# Patient Record
Sex: Female | Born: 1990 | Race: Asian | Hispanic: No | Marital: Single | Smoking: Former smoker
Health system: Southern US, Community
[De-identification: ages and names within clinical notes are randomized; demographics above are authoritative.]

## PROBLEM LIST (undated history)

## (undated) ENCOUNTER — Inpatient Hospital Stay (HOSPITAL_COMMUNITY): Payer: Medicaid Other

## (undated) ENCOUNTER — Inpatient Hospital Stay (HOSPITAL_COMMUNITY): Payer: Self-pay

## (undated) DIAGNOSIS — N390 Urinary tract infection, site not specified: Secondary | ICD-10-CM

## (undated) DIAGNOSIS — L0390 Cellulitis, unspecified: Secondary | ICD-10-CM

## (undated) HISTORY — PX: TONSILECTOMY, ADENOIDECTOMY, BILATERAL MYRINGOTOMY AND TUBES: SHX2538

---
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
## 2010-11-24 DIAGNOSIS — L0390 Cellulitis, unspecified: Secondary | ICD-10-CM

## 2010-11-24 HISTORY — DX: Cellulitis, unspecified: L03.90

## 2011-11-25 NOTE — L&D Delivery Note (Signed)
Delivery Note At 9:13 PM a viable female was delivered via Vaginal, Spontaneous Delivery (Presentation: Right Occiput Anterior).  APGAR: 9, 9; weight .   Placenta status: Intact, Spontaneous.  Cord: 3 vessels with the following complications: None.    Anesthesia: Epidural Local  Episiotomy: None Lacerations: 2nd degree;Perineal;Labial Suture Repair: 2.0 3.0 vicryl rapide Est. Blood Loss (mL): 350 ml  Mom to postpartum.  Baby to nursery-stable.  JACKSON-MOORE,Hayzel Ruberg A 06/09/2012, 9:47 PM

## 2012-03-14 ENCOUNTER — Inpatient Hospital Stay (HOSPITAL_COMMUNITY)
Admission: AD | Admit: 2012-03-14 | Discharge: 2012-03-14 | Disposition: A | Discharge status: Home / Self Care | Payer: Medicaid Other | Source: Ambulatory Visit | Attending: Obstetrics & Gynecology | Admitting: Obstetrics & Gynecology

## 2012-03-14 ENCOUNTER — Encounter (HOSPITAL_COMMUNITY): Payer: Self-pay

## 2012-03-14 DIAGNOSIS — O368190 Decreased fetal movements, unspecified trimester, not applicable or unspecified: Secondary | ICD-10-CM

## 2012-03-14 NOTE — Discharge Instructions (Signed)
Fetal Movement Counts Patient Name: __________________________________________________ Patient Due Date: ____________________ Kick counts is highly recommended in high risk pregnancies, but it is a good idea for every pregnant woman to do. Start counting fetal movements at 28 weeks of the pregnancy. Fetal movements increase after eating a full meal or eating or drinking something sweet (the blood sugar is higher). It is also important to drink plenty of fluids (well hydrated) before doing the count. Lie on your left side because it helps with the circulation or you can sit in a comfortable chair with your arms over your belly (abdomen) with no distractions around you. DOING THE COUNT  Try to do the count the same time of day each time you do it.   Mark the day and time, then see how long it takes for you to feel 10 movements (kicks, flutters, swishes, rolls). You should have at least 10 movements within 2 hours. You will most likely feel 10 movements in much less than 2 hours. If you do not, wait an hour and count again. After a couple of days you will see a pattern.   What you are looking for is a change in the pattern or not enough counts in 2 hours. Is it taking longer in time to reach 10 movements?  SEEK MEDICAL CARE IF:  You feel less than 10 counts in 2 hours. Tried twice.   No movement in one hour.   The pattern is changing or taking longer each day to reach 10 counts in 2 hours.   You feel the baby is not moving as it usually does.  Date: ____________ Movements: ____________ Start time: ____________ Finish time: ____________  Date: ____________ Movements: ____________ Start time: ____________ Finish time: ____________ Date: ____________ Movements: ____________ Start time: ____________ Finish time: ____________ Date: ____________ Movements: ____________ Start time: ____________ Finish time: ____________ Date: ____________ Movements: ____________ Start time: ____________ Finish time:  ____________ Date: ____________ Movements: ____________ Start time: ____________ Finish time: ____________ Date: ____________ Movements: ____________ Start time: ____________ Finish time: ____________ Date: ____________ Movements: ____________ Start time: ____________ Finish time: ____________  Date: ____________ Movements: ____________ Start time: ____________ Finish time: ____________ Date: ____________ Movements: ____________ Start time: ____________ Finish time: ____________ Date: ____________ Movements: ____________ Start time: ____________ Finish time: ____________ Date: ____________ Movements: ____________ Start time: ____________ Finish time: ____________ Date: ____________ Movements: ____________ Start time: ____________ Finish time: ____________ Date: ____________ Movements: ____________ Start time: ____________ Finish time: ____________ Date: ____________ Movements: ____________ Start time: ____________ Finish time: ____________  Date: ____________ Movements: ____________ Start time: ____________ Finish time: ____________ Date: ____________ Movements: ____________ Start time: ____________ Finish time: ____________ Date: ____________ Movements: ____________ Start time: ____________ Finish time: ____________ Date: ____________ Movements: ____________ Start time: ____________ Finish time: ____________ Date: ____________ Movements: ____________ Start time: ____________ Finish time: ____________ Date: ____________ Movements: ____________ Start time: ____________ Finish time: ____________ Date: ____________ Movements: ____________ Start time: ____________ Finish time: ____________  Date: ____________ Movements: ____________ Start time: ____________ Finish time: ____________ Date: ____________ Movements: ____________ Start time: ____________ Finish time: ____________ Date: ____________ Movements: ____________ Start time: ____________ Finish time: ____________ Date: ____________ Movements:  ____________ Start time: ____________ Finish time: ____________ Date: ____________ Movements: ____________ Start time: ____________ Finish time: ____________ Date: ____________ Movements: ____________ Start time: ____________ Finish time: ____________ Date: ____________ Movements: ____________ Start time: ____________ Finish time: ____________  Date: ____________ Movements: ____________ Start time: ____________ Finish time: ____________ Date: ____________ Movements: ____________ Start time: ____________ Finish time: ____________ Date: ____________ Movements: ____________ Start time:   ____________ Finish time: ____________ Date: ____________ Movements: ____________ Start time: ____________ Finish time: ____________ Date: ____________ Movements: ____________ Start time: ____________ Finish time: ____________ Date: ____________ Movements: ____________ Start time: ____________ Finish time: ____________ Date: ____________ Movements: ____________ Start time: ____________ Finish time: ____________  Date: ____________ Movements: ____________ Start time: ____________ Finish time: ____________ Date: ____________ Movements: ____________ Start time: ____________ Finish time: ____________ Date: ____________ Movements: ____________ Start time: ____________ Finish time: ____________ Date: ____________ Movements: ____________ Start time: ____________ Finish time: ____________ Date: ____________ Movements: ____________ Start time: ____________ Finish time: ____________ Date: ____________ Movements: ____________ Start time: ____________ Finish time: ____________ Date: ____________ Movements: ____________ Start time: ____________ Finish time: ____________  Date: ____________ Movements: ____________ Start time: ____________ Finish time: ____________ Date: ____________ Movements: ____________ Start time: ____________ Finish time: ____________ Date: ____________ Movements: ____________ Start time: ____________ Finish  time: ____________ Date: ____________ Movements: ____________ Start time: ____________ Finish time: ____________ Date: ____________ Movements: ____________ Start time: ____________ Finish time: ____________ Date: ____________ Movements: ____________ Start time: ____________ Finish time: ____________ Date: ____________ Movements: ____________ Start time: ____________ Finish time: ____________  Date: ____________ Movements: ____________ Start time: ____________ Finish time: ____________ Date: ____________ Movements: ____________ Start time: ____________ Finish time: ____________ Date: ____________ Movements: ____________ Start time: ____________ Finish time: ____________ Date: ____________ Movements: ____________ Start time: ____________ Finish time: ____________ Date: ____________ Movements: ____________ Start time: ____________ Finish time: ____________ Date: ____________ Movements: ____________ Start time: ____________ Finish time: ____________ Document Released: 12/10/2006 Document Revised: 10/30/2011 Document Reviewed: 06/12/2009 ExitCare Patient Information 2012 [employer]. 

## 2012-03-14 NOTE — MAU Note (Signed)
Bernita Buffy CNM into perform bedside ultrasound

## 2012-03-14 NOTE — MAU Provider Note (Signed)
.   Chief Complaint:  Decreased fetal movement  First Provider Initiated Contact with Patient 03/14/12 1139      HPI  Beverly Myers is  21 y.o. G1P0 at Unknown presents with 2 d hx. Feeling less frequent movements than usual. Had no movement for 2 hours after awakening today but now is feeling good fetal movement. Denies contractions, leakage of fluid or vaginal bleeding.  Pregnancy Course: uncomplicated  Past Medical History: Past Medical History  Diagnosis Date  . No pertinent past medical history     Past Surgical History: Past Surgical History  Procedure Date  . Tonsilectomy, adenoidectomy, bilateral myringotomy and tubes   . No past surgeries     Family History: No family history on file.  Social History: History  Substance Use Topics  . Smoking status: Former Games developer  . Smokeless tobacco: Not on file  . Alcohol Use: No    Allergies: No Known Allergies  Meds:  Prescriptions prior to admission  Medication Sig Dispense Refill  . Prenatal Vit-Fe Fumarate-FA (PRENATAL MULTIVITAMIN) TABS Take 1 tablet by mouth every morning.             Physical Exam  Blood pressure 112/72, pulse 98, temperature 98.3 F (36.8 C), temperature source Oral, resp. rate 16. GENERAL: Well-developed, well-nourished female in no acute distress.  ABDOMEN: Soft, nontender, nondistended, gravid 51fb below costal margin  FHT:  Baseline 140 , moderate variability, 10 bpm accelerations present,  Isolated mild variable deceleration Contractions: none    Imaging:   Bedside US by me: good FM and normal AFV Assessment: G1 28 wk  Decreased fetal movement resolved  Plan: Medication List  As of 03/14/2012 11:40 AM   ASK your doctor about these medications         prenatal multivitamin Tabs          Kick counts Keep appointment this week for prenatal visit         Brent Taillon 4/21/201311:40 AM

## 2012-03-22 LAB — OB RESULTS CONSOLE ANTIBODY SCREEN: Antibody Screen: NEGATIVE

## 2012-03-22 LAB — OB RESULTS CONSOLE RPR: RPR: NONREACTIVE

## 2012-03-22 LAB — OB RESULTS CONSOLE HIV ANTIBODY (ROUTINE TESTING): HIV: NONREACTIVE

## 2012-03-22 LAB — OB RESULTS CONSOLE ABO/RH: RH Type: POSITIVE

## 2012-05-01 ENCOUNTER — Encounter (HOSPITAL_COMMUNITY): Payer: Self-pay | Admitting: *Deleted

## 2012-05-01 ENCOUNTER — Inpatient Hospital Stay (HOSPITAL_COMMUNITY)
Admission: AD | Admit: 2012-05-01 | Discharge: 2012-05-01 | Disposition: A | Discharge status: Home / Self Care | Payer: Medicaid Other | Source: Ambulatory Visit | Attending: Obstetrics & Gynecology | Admitting: Obstetrics & Gynecology

## 2012-05-01 DIAGNOSIS — O26899 Other specified pregnancy related conditions, unspecified trimester: Secondary | ICD-10-CM

## 2012-05-01 DIAGNOSIS — R109 Unspecified abdominal pain: Secondary | ICD-10-CM

## 2012-05-01 DIAGNOSIS — O99891 Other specified diseases and conditions complicating pregnancy: Secondary | ICD-10-CM | POA: Insufficient documentation

## 2012-05-01 LAB — URINE MICROSCOPIC-ADD ON

## 2012-05-01 LAB — URINALYSIS, ROUTINE W REFLEX MICROSCOPIC
Glucose, UA: NEGATIVE mg/dL
Hgb urine dipstick: NEGATIVE
Ketones, ur: NEGATIVE mg/dL
Protein, ur: NEGATIVE mg/dL

## 2012-05-01 NOTE — MAU Note (Signed)
Patient states onset of abdominal pressure since this morning, no vaginal bleeding, no dysuria.

## 2012-05-01 NOTE — MAU Provider Note (Signed)
History   Pt presents today c/o lower abd pain and pressure since she woke up this am. She states the pain was worse when she was walking at work. She has noticed improvement since sitting in the MAU. She reports GFM and denies vag dc, bleeding, fever, or any other sx at this time.  CSN: [id number]  Arrival date and time: 05/01/12 1459   First Provider Initiated Contact with Patient 05/01/12 1649      Chief Complaint  Patient presents with  . Abdominal Pain   HPI  OB History    Grav Para Term Preterm Abortions TAB SAB Ect Mult Living   1               Past Medical History  Diagnosis Date  . No pertinent past medical history     Past Surgical History  Procedure Date  . Tonsilectomy, adenoidectomy, bilateral myringotomy and tubes     No family history on file.  History  Substance Use Topics  . Smoking status: Former Games developer  . Smokeless tobacco: Not on file  . Alcohol Use: No    Allergies: No Known Allergies  Prescriptions prior to admission  Medication Sig Dispense Refill  . Prenatal Vit-Fe Fumarate-FA (PRENATAL MULTIVITAMIN) TABS Take 1 tablet by mouth every morning.        Review of Systems  Constitutional: Negative for fever and chills.  Eyes: Negative for blurred vision, double vision and photophobia.  Respiratory: Negative for cough, hemoptysis, sputum production, shortness of breath and wheezing.   Cardiovascular: Negative for chest pain and palpitations.  Gastrointestinal: Positive for abdominal pain. Negative for nausea, vomiting, diarrhea and constipation.  Genitourinary: Negative for dysuria, urgency, frequency, hematuria and flank pain.  Neurological: Negative for dizziness and headaches.  Psychiatric/Behavioral: Negative for depression and suicidal ideas.   Physical Exam   Blood pressure 102/60, pulse 97, temperature 98.3 F (36.8 C), temperature source Oral, height 5\' 3"  (1.6 m), weight 133 lb 12.8 oz (60.691 kg).  Physical Exam  Nursing note  and vitals reviewed. Constitutional: She is oriented to person, place, and time. She appears well-developed and well-nourished. No distress.  HENT:  Head: Normocephalic and atraumatic.  Eyes: EOM are normal. Pupils are equal, round, and reactive to light.  GI: Soft. She exhibits no distension and no mass. There is no tenderness. There is no rebound and no guarding.  Genitourinary: No bleeding around the vagina. No vaginal discharge found.       Cervix Lg/closed.  Neurological: She is alert and oriented to person, place, and time.  Skin: Skin is warm and dry. She is not diaphoretic.  Psychiatric: She has a normal mood and affect. Her behavior is normal. Judgment and thought content normal.    MAU Course  Procedures  NST reactive with irregular ctx.   Results for orders placed during the hospital encounter of 05/01/12 (from the past 48 hour(s))  URINALYSIS, ROUTINE W REFLEX MICROSCOPIC     Status: Abnormal   Collection Time   05/01/12  3:10 PM      Component Value Range Comment   Color, Urine YELLOW  YELLOW     APPearance CLEAR  CLEAR     Specific Gravity, Urine 1.015  1.005 - 1.030     pH 6.5  5.0 - 8.0     Glucose, UA NEGATIVE  NEGATIVE (mg/dL)    Hgb urine dipstick NEGATIVE  NEGATIVE     Bilirubin Urine NEGATIVE  NEGATIVE     Ketones, ur  NEGATIVE  NEGATIVE (mg/dL)    Protein, ur NEGATIVE  NEGATIVE (mg/dL)    Urobilinogen, UA 0.2  0.0 - 1.0 (mg/dL)    Nitrite NEGATIVE  NEGATIVE     Leukocytes, UA TRACE (*) NEGATIVE    URINE MICROSCOPIC-ADD ON     Status: Abnormal   Collection Time   05/01/12  3:10 PM      Component Value Range Comment   Squamous Epithelial / LPF FEW (*) RARE     WBC, UA 3-6  <3 (WBC/hpf)    RBC / HPF 0-2  <3 (RBC/hpf)    Bacteria, UA RARE  RARE     Urine-Other MUCOUS PRESENT        Assessment and Plan  Abd pain in preg: discussed with pt at length. Discussed diet, activity, risks, and precautions. She will f/u with Dr. Tamela Oddi. Reminded of  [facility].  Clinton Gallant. Jayzen Paver III, DrHSc, MPAS, PA-C  05/01/2012, 5:01 PM

## 2012-05-01 NOTE — MAU Note (Signed)
Patient is [redacted]w[redacted]d

## 2012-05-01 NOTE — Discharge Instructions (Signed)
Abdominal Pain During Pregnancy °Belly (abdominal) pain is common during pregnancy. Most of the time, it is not a serious problem. Other times, it can be a sign that something is wrong with the pregnancy. Always tell your doctor if you have belly pain. °HOME CARE °For mild pain: °· Do not have sex (intercourse) or put anything in your vagina until you feel better.  °· Rest until your pain stops. If your pain lasts longer than 1 hour, call your doctor.  °· Drink clear fluids if you feel sick to your stomach (nauseous).  °· Do not eat solid food until you feel better.  °· Only take medicine as told by your doctor.  °· Keep all doctor visits as told.  °GET HELP RIGHT AWAY IF:  °· You are bleeding, leaking fluid, or pieces of tissue come out of your vagina.  °· You have more pain or cramping.  °· You keep throwing up (vomiting).  °· You have pain when you pee (urinate) or have blood in your pee.  °· You have a fever.  °· You do not feel your baby moving as much.  °· You feel very weak or feel like passing out.  °· You have trouble breathing, with or without belly pain.  °· You have a very bad headache and belly pain.  °· You have fluid leaking from your vagina and belly pain.  °· You keep having watery poop (diarrhea).  °· Your belly pain does not go away after resting, or the pain gets worse.  °MAKE SURE YOU:  °· Understand these instructions.  °· Will watch your condition.  °· Will get help right away if you are not doing well or get worse.  °Document Released: 10/29/2009 Document Revised: 10/30/2011 Document Reviewed: 06/06/2011 °Exit[employer]® Patient Information ©2012 [employer]. °

## 2012-05-12 LAB — OB RESULTS CONSOLE GBS: GBS: NEGATIVE

## 2012-06-06 ENCOUNTER — Inpatient Hospital Stay (HOSPITAL_COMMUNITY)
Admission: AD | Admit: 2012-06-06 | Discharge: 2012-06-07 | Disposition: A | Discharge status: Home / Self Care | Payer: Medicaid Other | Source: Ambulatory Visit | Attending: Obstetrics & Gynecology | Admitting: Obstetrics & Gynecology

## 2012-06-06 ENCOUNTER — Encounter (HOSPITAL_COMMUNITY): Payer: Self-pay | Admitting: *Deleted

## 2012-06-06 DIAGNOSIS — O99891 Other specified diseases and conditions complicating pregnancy: Secondary | ICD-10-CM | POA: Insufficient documentation

## 2012-06-06 DIAGNOSIS — O479 False labor, unspecified: Secondary | ICD-10-CM | POA: Insufficient documentation

## 2012-06-06 NOTE — MAU Note (Signed)
Patient on the treadmill and felt like something was about to come out. Called EMS. No cord showing nor leaking fluid. Good fetal movement. No vaginal bleeding.

## 2012-06-08 ENCOUNTER — Inpatient Hospital Stay (HOSPITAL_COMMUNITY)
Admission: AD | Admit: 2012-06-08 | Discharge: 2012-06-11 | DRG: 775 | Disposition: A | Discharge status: Home / Self Care | Payer: Medicaid Other | Source: Ambulatory Visit | Attending: Obstetrics | Admitting: Obstetrics

## 2012-06-08 ENCOUNTER — Other Ambulatory Visit: Payer: Self-pay | Admitting: Obstetrics

## 2012-06-08 ENCOUNTER — Encounter (HOSPITAL_COMMUNITY): Payer: Self-pay | Admitting: Obstetrics

## 2012-06-08 DIAGNOSIS — O368190 Decreased fetal movements, unspecified trimester, not applicable or unspecified: Secondary | ICD-10-CM | POA: Diagnosis present

## 2012-06-08 DIAGNOSIS — O480 Post-term pregnancy: Principal | ICD-10-CM | POA: Diagnosis present

## 2012-06-08 DIAGNOSIS — Z3689 Encounter for other specified antenatal screening: Secondary | ICD-10-CM

## 2012-06-08 HISTORY — DX: Cellulitis, unspecified: L03.90

## 2012-06-08 HISTORY — DX: Urinary tract infection, site not specified: N39.0

## 2012-06-08 LAB — TYPE AND SCREEN

## 2012-06-08 LAB — CBC
HCT: 38.7 % (ref 36.0–46.0)
Hemoglobin: 12.7 g/dL (ref 12.0–15.0)
MCH: 28.8 pg (ref 26.0–34.0)
MCHC: 32.8 g/dL (ref 30.0–36.0)
RDW: 14.2 % (ref 11.5–15.5)

## 2012-06-08 MED ORDER — MISOPROSTOL 25 MCG QUARTER TABLET
25.0000 ug | ORAL_TABLET | ORAL | Status: DC | PRN
  Administered 2012-06-08: 25 ug via VAGINAL
  Filled 2012-06-08: qty 0.25
  Filled 2012-06-08: qty 1

## 2012-06-08 MED ORDER — IBUPROFEN 600 MG PO TABS: 600.0000 mg | ORAL_TABLET | Freq: Four times a day (QID) | ORAL | Status: DC | PRN

## 2012-06-08 MED ORDER — OXYTOCIN 40 UNITS IN LACTATED RINGERS INFUSION - SIMPLE MED: 62.5000 mL/h | Freq: Once | INTRAVENOUS | Status: DC

## 2012-06-08 MED ORDER — MISOPROSTOL 25 MCG QUARTER TABLET: 25.0000 ug | ORAL_TABLET | ORAL | Status: DC | PRN

## 2012-06-08 MED ORDER — HYDROXYZINE HCL 50 MG/ML IM SOLN: 50.0000 mg | Freq: Four times a day (QID) | INTRAMUSCULAR | Status: DC | PRN

## 2012-06-08 MED ORDER — OXYTOCIN BOLUS FROM INFUSION
250.0000 mL | Freq: Once | INTRAVENOUS | Status: AC
  Administered 2012-06-09: 250 mL via INTRAVENOUS
  Filled 2012-06-08: qty 500

## 2012-06-08 MED ORDER — LACTATED RINGERS IV SOLN
INTRAVENOUS | Status: DC
  Administered 2012-06-09 (×2): via INTRAVENOUS

## 2012-06-08 MED ORDER — LIDOCAINE HCL (PF) 1 % IJ SOLN
30.0000 mL | INTRAMUSCULAR | Status: DC | PRN
  Administered 2012-06-09: 30 mL via SUBCUTANEOUS
  Filled 2012-06-08: qty 30

## 2012-06-08 MED ORDER — TERBUTALINE SULFATE 1 MG/ML IJ SOLN: 0.2500 mg | Freq: Once | INTRAMUSCULAR | Status: AC | PRN

## 2012-06-08 MED ORDER — OXYCODONE-ACETAMINOPHEN 5-325 MG PO TABS: 1.0000 | ORAL_TABLET | ORAL | Status: DC | PRN

## 2012-06-08 MED ORDER — HYDROXYZINE HCL 50 MG PO TABS
50.0000 mg | ORAL_TABLET | Freq: Four times a day (QID) | ORAL | Status: DC | PRN
  Filled 2012-06-08: qty 1

## 2012-06-08 MED ORDER — PROMETHAZINE HCL 25 MG/ML IJ SOLN: 12.5000 mg | Freq: Four times a day (QID) | INTRAMUSCULAR | Status: DC | PRN

## 2012-06-08 MED ORDER — ACETAMINOPHEN 325 MG PO TABS: 650.0000 mg | ORAL_TABLET | ORAL | Status: DC | PRN

## 2012-06-08 MED ORDER — PANTOPRAZOLE SODIUM 40 MG PO TBEC
40.0000 mg | DELAYED_RELEASE_TABLET | Freq: Every day | ORAL | Status: DC | PRN
  Filled 2012-06-08: qty 1

## 2012-06-08 MED ORDER — ONDANSETRON HCL 4 MG/2ML IJ SOLN: 4.0000 mg | Freq: Four times a day (QID) | INTRAMUSCULAR | Status: DC | PRN

## 2012-06-08 MED ORDER — LACTATED RINGERS IV SOLN
500.0000 mL | INTRAVENOUS | Status: DC | PRN
  Administered 2012-06-09 (×3): 1000 mL via INTRAVENOUS

## 2012-06-08 MED ORDER — BUTORPHANOL TARTRATE 1 MG/ML IJ SOLN
1.0000 mg | INTRAMUSCULAR | Status: DC | PRN
  Administered 2012-06-09 (×2): 1 mg via INTRAVENOUS
  Filled 2012-06-08 (×2): qty 1

## 2012-06-08 MED ORDER — CITRIC ACID-SODIUM CITRATE 334-500 MG/5ML PO SOLN: 30.0000 mL | ORAL | Status: DC | PRN

## 2012-06-08 NOTE — H&P (Signed)
Beverly Myers is a 21 y.o. female presenting for IOL for decreased FM, postdates and nonreactive NST.Marland Kitchen Maternal Medical History:  Reason for admission: 21 yo G1  [facility] 06-07-12.  Presented to office with decreased FM.  NST done and was nonreactive.  Patient admitted to [facility] for IOL for decreased FM, postdates.  Fetal activity: Perceived fetal activity is decreased.   Last perceived fetal movement was within the past 12 hours.    Prenatal complications: no prenatal complications   OB History    Grav Para Term Preterm Abortions TAB SAB Ect Mult Living   1              Past Medical History  Diagnosis Date  . No pertinent past medical history    Past Surgical History  Procedure Date  . Tonsilectomy, adenoidectomy, bilateral myringotomy and tubes    Family History: family history is not on file. Social History:  reports that she has quit smoking. She does not have any smokeless tobacco history on file. She reports that she does not drink alcohol or use illicit drugs.   Prenatal Transfer Tool  Maternal Diabetes: No Genetic Screening: Normal Maternal Ultrasounds/Referrals: Normal Fetal Ultrasounds or other Referrals:  None Maternal Substance Abuse:  No Significant Maternal Medications:  Meds include: Other: see prenatal record Significant Maternal Lab Results:  Lab values include: Group B Strep negative Other Comments:  None  Review of Systems  All other systems reviewed and are negative.      There were no vitals taken for this visit. Maternal Exam:  Abdomen: Patient reports no abdominal tenderness. Fetal presentation: vertex  Introitus: Normal vulva. Normal vagina.  Pelvis: adequate for delivery.   Cervix: Cervix evaluated by digital exam.     Physical Exam  Nursing note and vitals reviewed. Constitutional: She is oriented to person, place, and time. She appears well-developed and well-nourished.  HENT:  Head: Normocephalic and atraumatic.  Eyes: Conjunctivae are  normal. Pupils are equal, round, and reactive to light.  Neck: Normal range of motion. Neck supple.  Cardiovascular: Normal rate and regular rhythm.   Respiratory: Effort normal.  GI: Soft.  Musculoskeletal: Normal range of motion.  Neurological: She is alert and oriented to person, place, and time.  Skin: Skin is warm and dry.  Psychiatric: She has a normal mood and affect. Her behavior is normal. Judgment and thought content normal.    Prenatal labs: ABO, Rh:   Antibody:   Rubella:   RPR:    HBsAg:    HIV:    GBS:     Assessment/Plan: 40.1 weeks.  Decreased FM.  Nonreactive NST.  Proceed with IOL.   Beverly Myers A 06/08/2012, 8:07 PM

## 2012-06-09 ENCOUNTER — Encounter (HOSPITAL_COMMUNITY): Payer: Self-pay | Admitting: Anesthesiology

## 2012-06-09 ENCOUNTER — Inpatient Hospital Stay (HOSPITAL_COMMUNITY): Payer: Medicaid Other | Admitting: Anesthesiology

## 2012-06-09 ENCOUNTER — Encounter (HOSPITAL_COMMUNITY): Payer: Self-pay

## 2012-06-09 DIAGNOSIS — O480 Post-term pregnancy: Secondary | ICD-10-CM | POA: Diagnosis present

## 2012-06-09 DIAGNOSIS — Z3689 Encounter for other specified antenatal screening: Secondary | ICD-10-CM

## 2012-06-09 LAB — RPR: RPR Ser Ql: NONREACTIVE

## 2012-06-09 MED ORDER — TERBUTALINE SULFATE 1 MG/ML IJ SOLN: 0.2500 mg | Freq: Once | INTRAMUSCULAR | Status: DC | PRN

## 2012-06-09 MED ORDER — BENZOCAINE-MENTHOL 20-0.5 % EX AERO
1.0000 "application " | INHALATION_SPRAY | CUTANEOUS | Status: DC | PRN
  Administered 2012-06-10: 1 via TOPICAL
  Filled 2012-06-09: qty 56

## 2012-06-09 MED ORDER — TETANUS-DIPHTH-ACELL PERTUSSIS 5-2.5-18.5 LF-MCG/0.5 IM SUSP: 0.5000 mL | Freq: Once | INTRAMUSCULAR | Status: DC

## 2012-06-09 MED ORDER — MAGNESIUM HYDROXIDE 400 MG/5ML PO SUSP: 30.0000 mL | ORAL | Status: DC | PRN

## 2012-06-09 MED ORDER — FERROUS SULFATE 325 (65 FE) MG PO TABS
325.0000 mg | ORAL_TABLET | Freq: Two times a day (BID) | ORAL | Status: DC
Start: 2012-06-10 — End: 2012-06-11
  Administered 2012-06-10 – 2012-06-11 (×3): 325 mg via ORAL
  Filled 2012-06-09 (×3): qty 1

## 2012-06-09 MED ORDER — PHENYLEPHRINE 40 MCG/ML (10ML) SYRINGE FOR IV PUSH (FOR BLOOD PRESSURE SUPPORT)
80.0000 ug | PREFILLED_SYRINGE | INTRAVENOUS | Status: DC | PRN
  Filled 2012-06-09: qty 5

## 2012-06-09 MED ORDER — ONDANSETRON HCL 4 MG/2ML IJ SOLN: 4.0000 mg | INTRAMUSCULAR | Status: DC | PRN

## 2012-06-09 MED ORDER — DIPHENHYDRAMINE HCL 25 MG PO CAPS: 25.0000 mg | ORAL_CAPSULE | Freq: Four times a day (QID) | ORAL | Status: DC | PRN

## 2012-06-09 MED ORDER — OXYCODONE-ACETAMINOPHEN 5-325 MG PO TABS
1.0000 | ORAL_TABLET | ORAL | Status: DC | PRN
  Administered 2012-06-10: 1 via ORAL
  Administered 2012-06-11: 2 via ORAL
  Administered 2012-06-11: 1 via ORAL
  Filled 2012-06-09 (×2): qty 1
  Filled 2012-06-09: qty 2

## 2012-06-09 MED ORDER — ZOLPIDEM TARTRATE 5 MG PO TABS: 5.0000 mg | ORAL_TABLET | Freq: Every evening | ORAL | Status: DC | PRN

## 2012-06-09 MED ORDER — MEDROXYPROGESTERONE ACETATE 150 MG/ML IM SUSP: 150.0000 mg | INTRAMUSCULAR | Status: DC | PRN

## 2012-06-09 MED ORDER — PRENATAL MULTIVITAMIN CH
1.0000 | ORAL_TABLET | Freq: Every day | ORAL | Status: DC
  Administered 2012-06-10 – 2012-06-11 (×2): 1 via ORAL
  Filled 2012-06-09 (×2): qty 1

## 2012-06-09 MED ORDER — IBUPROFEN 600 MG PO TABS
600.0000 mg | ORAL_TABLET | Freq: Four times a day (QID) | ORAL | Status: DC
  Administered 2012-06-10 – 2012-06-11 (×7): 600 mg via ORAL
  Filled 2012-06-09 (×7): qty 1

## 2012-06-09 MED ORDER — DIPHENHYDRAMINE HCL 50 MG/ML IJ SOLN: 12.5000 mg | INTRAMUSCULAR | Status: DC | PRN

## 2012-06-09 MED ORDER — LANOLIN HYDROUS EX OINT: TOPICAL_OINTMENT | CUTANEOUS | Status: DC | PRN

## 2012-06-09 MED ORDER — EPHEDRINE 5 MG/ML INJ
10.0000 mg | INTRAVENOUS | Status: DC | PRN
  Filled 2012-06-09: qty 4

## 2012-06-09 MED ORDER — ONDANSETRON HCL 4 MG PO TABS: 4.0000 mg | ORAL_TABLET | ORAL | Status: DC | PRN

## 2012-06-09 MED ORDER — FENTANYL 2.5 MCG/ML BUPIVACAINE 1/10 % EPIDURAL INFUSION (WH - ANES)
INTRAMUSCULAR | Status: DC | PRN
  Administered 2012-06-09: 14 mL/h via EPIDURAL

## 2012-06-09 MED ORDER — MEASLES, MUMPS & RUBELLA VAC ~~LOC~~ INJ
0.5000 mL | INJECTION | Freq: Once | SUBCUTANEOUS | Status: DC
  Filled 2012-06-09: qty 0.5

## 2012-06-09 MED ORDER — SODIUM BICARBONATE 8.4 % IV SOLN
INTRAVENOUS | Status: DC | PRN
  Administered 2012-06-09: 4 mL via EPIDURAL

## 2012-06-09 MED ORDER — PHENYLEPHRINE 40 MCG/ML (10ML) SYRINGE FOR IV PUSH (FOR BLOOD PRESSURE SUPPORT): 80.0000 ug | PREFILLED_SYRINGE | INTRAVENOUS | Status: DC | PRN

## 2012-06-09 MED ORDER — WITCH HAZEL-GLYCERIN EX PADS: 1.0000 | MEDICATED_PAD | CUTANEOUS | Status: DC | PRN

## 2012-06-09 MED ORDER — OXYTOCIN 40 UNITS IN LACTATED RINGERS INFUSION - SIMPLE MED
1.0000 m[IU]/min | INTRAVENOUS | Status: DC
  Administered 2012-06-09: 1 m[IU]/min via INTRAVENOUS
  Filled 2012-06-09: qty 1000

## 2012-06-09 MED ORDER — LACTATED RINGERS IV SOLN: 500.0000 mL | Freq: Once | INTRAVENOUS | Status: DC

## 2012-06-09 MED ORDER — DIBUCAINE 1 % RE OINT: 1.0000 "application " | TOPICAL_OINTMENT | RECTAL | Status: DC | PRN

## 2012-06-09 MED ORDER — SENNOSIDES-DOCUSATE SODIUM 8.6-50 MG PO TABS
2.0000 | ORAL_TABLET | Freq: Every day | ORAL | Status: DC
  Administered 2012-06-10: 2 via ORAL

## 2012-06-09 MED ORDER — FENTANYL 2.5 MCG/ML BUPIVACAINE 1/10 % EPIDURAL INFUSION (WH - ANES)
14.0000 mL/h | INTRAMUSCULAR | Status: DC
  Administered 2012-06-09: 14 mL/h via EPIDURAL
  Filled 2012-06-09 (×2): qty 60

## 2012-06-09 MED ORDER — EPHEDRINE 5 MG/ML INJ: 10.0000 mg | INTRAVENOUS | Status: DC | PRN

## 2012-06-09 NOTE — Progress Notes (Signed)
Informed Dr Clearance Coots via phone that pt was contracting every 2-4 minutes. Will hold next scheduled dose of Cytotec and was given orders to start Pitocin at 0500

## 2012-06-09 NOTE — Progress Notes (Signed)
Beverly Myers is a 21 y.o. G1P0000 at [redacted]w[redacted]d by LMP admitted for induction of labor due to Non-reactive NST and decreased fetal movement..  Subjective:  No complaints.   Objective: BP 120/77  Pulse 67  Temp 98.4 F (36.9 C) (Oral)  Resp 18  Ht 5\' 3"  (1.6 m)  Wt 63.05 kg (139 lb)  BMI 24.62 kg/m2      FHT:  FHR: 150 bpm, variability: moderate,  accelerations:  Present,  decelerations:  Absent UC:   regular, every 3-5 minutes SVE:   Dilation: 1.5 Effacement (%): 60 Station: -2 Exam by:: Larose Kells RN  Labs: Lab Results  Component Value Date   WBC 7.6 06/08/2012   HGB 12.7 06/08/2012   HCT 38.7 06/08/2012   MCV 87.8 06/08/2012   PLT 204 06/08/2012    Assessment / Plan: Induction of labor due to nonreactive NST and decreased fetal movement.,  progressing well on pitocin  Labor: Progressing normally Preeclampsia:  n/a Fetal Wellbeing:  Category I Pain Control:  Labor support without medications I/D:  n/a Anticipated MOD:  NSVD  Beverly Myers A 06/09/2012, 8:56 AM

## 2012-06-09 NOTE — Anesthesia Preprocedure Evaluation (Addendum)

## 2012-06-09 NOTE — Anesthesia Procedure Notes (Signed)

## 2012-06-09 NOTE — Progress Notes (Signed)
Beverly Myers is a 21 y.o. G1P0000 at [redacted]w[redacted]d by LMP admitted for induction of labor due to Non-reactive NST.  Subjective: Comfortable  Objective: BP 109/54  Pulse 63  Temp 99.3 F (37.4 C) (Oral)  Resp 16  Ht 5\' 3"  (1.6 m)  Wt 63.05 kg (139 lb)  BMI 24.62 kg/m2  SpO2 97% I/O last 3 completed shifts: In: -  Out: 150 [Urine:150]    FHT:  FHR: 140 bpm, variability: moderate,  accelerations:  Present,  decelerations:  Absent UC:   regular, every 2 minutes SVE:   Dilation: 10 Effacement (%): 90 Station: +1 Exam by:: l.poore, rn  Labs: Lab Results  Component Value Date   WBC 7.6 06/08/2012   HGB 12.7 06/08/2012   HCT 38.7 06/08/2012   MCV 87.8 06/08/2012   PLT 204 06/08/2012    Assessment / Plan: Induction of labor due to non-reactive NST,  progressing well on pitocin Stage II Malpresentation/OP  Labor: Progressing normally and passive second stage for now Preeclampsia:  n/a Fetal Wellbeing:  Category I Pain Control:  Epidural I/D:  n/a Anticipated MOD:  NSVD  JACKSON-MOORE,Dannika Hilgeman A 06/09/2012, 8:20 PM

## 2012-06-10 NOTE — Progress Notes (Signed)
UR Chart review completed.  

## 2012-06-10 NOTE — Progress Notes (Signed)
Post Partum Day 1 Subjective: no complaints, up ad lib, voiding and tolerating PO  Objective: Blood pressure 100/60, pulse 71, temperature 98.1 F (36.7 C), temperature source Oral, resp. rate 16, height 5\' 3"  (1.6 m), weight 63.05 kg (139 lb), SpO2 97.00%, unknown if currently breastfeeding.  Physical Exam:  General: alert and no distress Lochia: appropriate Uterine Fundus: firm Incision: healing well DVT Evaluation: No evidence of DVT seen on physical exam.   Basename 06/08/12 2045  HGB 12.7  HCT 38.7    Assessment/Plan: Plan for discharge tomorrow   LOS: 2 days   [name] A 06/10/2012, 8:14 AM

## 2012-06-10 NOTE — Anesthesia Postprocedure Evaluation (Signed)
  Anesthesia Post-op Note  Patient: Beverly Myers  Procedure(s) Performed: * No procedures listed *  Patient Location: Mother/Baby  Anesthesia Type: Epidural  Level of Consciousness: awake  Airway and Oxygen Therapy: Patient Spontanous Breathing  Post-op Pain: mild  Post-op Assessment: Patient's Cardiovascular Status Stable and Respiratory Function Stable  Post-op Vital Signs: stable  Complications: No apparent anesthesia complications

## 2012-06-11 MED ORDER — OXYCODONE-ACETAMINOPHEN 5-325 MG PO TABS: 1.0000 | ORAL_TABLET | ORAL | Status: AC | PRN

## 2012-06-11 MED ORDER — IBUPROFEN 600 MG PO TABS: 600.0000 mg | ORAL_TABLET | Freq: Four times a day (QID) | ORAL | Status: AC

## 2012-06-11 NOTE — Progress Notes (Signed)
Post Partum Day 2 Subjective: no complaints, up ad lib, voiding and tolerating PO  Objective: Blood pressure 99/62, pulse 99, temperature 98.2 F (36.8 C), temperature source Oral, resp. rate 18, height 5\' 3"  (1.6 m), weight 63.05 kg (139 lb), SpO2 99.00%, unknown if currently breastfeeding.  Physical Exam:  General: alert and no distress Lochia: appropriate Uterine Fundus: firm Incision: healing well DVT Evaluation: No evidence of DVT seen on physical exam.   Basename 06/08/12 2045  HGB 12.7  HCT 38.7    Assessment/Plan: Discharge home   LOS: 3 days   Shakeda Pearse A 06/11/2012, 7:57 AM

## 2012-06-11 NOTE — Discharge Summary (Signed)
Obstetric Discharge Summary Reason for Admission: induction of labor Prenatal Procedures: NST and ultrasound Intrapartum Procedures: spontaneous vaginal delivery Postpartum Procedures: none Complications-Operative and Postpartum: none Hemoglobin  Date Value Range Status  06/08/2012 12.7  12.0 - 15.0 g/dL Final     HCT  Date Value Range Status  06/08/2012 38.7  36.0 - 46.0 % Final    Physical Exam:  General: alert and no distress Lochia: appropriate Uterine Fundus: firm Incision: healing well DVT Evaluation: No evidence of DVT seen on physical exam.  Discharge Diagnoses: Term Pregnancy-delivered  Discharge Information: Date: 06/11/2012 Activity: pelvic rest Diet: routine Medications: PNV, Ibuprofen, Colace and Percocet Condition: stable Instructions: refer to practice specific booklet Discharge to: home Follow-up Information    Follow up with Antionette Char A, MD. Schedule an appointment as soon as possible for a visit in 6 weeks.   Contact information:   [address] [place] [address] [phone]          Newborn Data: Live born female  Birth Weight: 7 lb 14.1 oz (3575 g) APGAR: 9, 9  Home with mother.  Teryl Gubler A 06/11/2012, 8:02 AM

## 2013-10-26 ENCOUNTER — Ambulatory Visit (INDEPENDENT_AMBULATORY_CARE_PROVIDER_SITE_OTHER): Payer: Medicaid Other | Admitting: Obstetrics & Gynecology

## 2013-10-26 ENCOUNTER — Encounter: Payer: Self-pay | Admitting: Obstetrics & Gynecology

## 2013-10-26 VITALS — BP 117/72 | HR 83 | Temp 98.7°F | Ht 63.0 in | Wt 101.0 lb

## 2013-10-26 DIAGNOSIS — Z0000 Encounter for general adult medical examination without abnormal findings: Secondary | ICD-10-CM

## 2013-10-26 DIAGNOSIS — Z3202 Encounter for pregnancy test, result negative: Secondary | ICD-10-CM

## 2013-10-26 LAB — POCT URINALYSIS DIPSTICK
Blood, UA: NEGATIVE
Ketones, UA: NEGATIVE
Protein, UA: NEGATIVE
Spec Grav, UA: <=1.005
Urobilinogen, UA: 1

## 2013-10-26 LAB — COMPREHENSIVE METABOLIC PANEL
ALT: 8 U/L (ref 0–35)
AST: 14 U/L (ref 0–37)
CO2: 26 mEq/L (ref 19–32)
Calcium: 9.8 mg/dL (ref 8.4–10.5)
Chloride: 104 mEq/L (ref 96–112)
Creat: 0.71 mg/dL (ref 0.50–1.10)
Sodium: 139 mEq/L (ref 135–145)
Total Bilirubin: 0.9 mg/dL (ref 0.3–1.2)
Total Protein: 7.7 g/dL (ref 6.0–8.3)

## 2013-10-26 LAB — POCT URINE PREGNANCY: Preg Test, Ur: NEGATIVE

## 2013-10-26 NOTE — Progress Notes (Signed)
  Subjective:     Beverly Myers is a 22 y.o. female here for a routine exam.  Current complaints: none.  Personal health questionnaire reviewed: no.   Gynecologic History Patient's last menstrual period was 10/16/2013. Contraception: condoms   Obstetric History OB History  Gravida Para Term Preterm AB SAB TAB Ectopic Multiple Living  1 1 1  0 0 0 0 0 0 1    # Outcome Date GA Lbr Len/2nd Weight Sex Delivery Anes PTL Lv  1 TRM 06/09/12 [redacted]w[redacted]d 03:48 / 02:25 7 lb 14.1 oz (3.575 kg) M SVD EPI  Y       The following portions of the patient's history were reviewed and updated as appropriate: allergies, current medications, past family history, past medical history, past social history, past surgical history and problem list.  Review of Systems Pertinent items are noted in HPI.    Objective:      General appearance: alert Breasts: normal appearance, no masses or tenderness Abdomen: soft, non-tender; bowel sounds normal; no masses,  no organomegaly Pelvic: cervix normal in appearance, external genitalia normal, no adnexal masses or tenderness, uterus normal size, shape, and consistency and vagina normal without discharge    Assessment:    Healthy female exam.    Plan:   Orders Placed This Encounter  Procedures  . HIV antibody  . Hepatitis B surface antigen  . RPR  . Hepatitis C antibody  . Comprehensive metabolic panel  . TSH  . CBC  . POCT Urinalysis Dipstick  . POCT urine pregnancy  Return prn

## 2013-10-26 NOTE — Progress Notes (Deleted)
Pt in office for annual exam, complains of thick white discharge with itching.

## 2013-10-27 ENCOUNTER — Encounter: Payer: Self-pay | Admitting: Obstetrics & Gynecology

## 2013-10-27 LAB — HEPATITIS C ANTIBODY: HCV Ab: NEGATIVE

## 2013-10-27 LAB — PAP IG, CT-NG, RFX HPV ASCU: GC Probe Amp: NEGATIVE

## 2013-10-27 LAB — RPR

## 2013-10-29 NOTE — Patient Instructions (Signed)
Contraception Choices Contraception (birth control) is the use of any methods or devices to prevent pregnancy. Below are some methods to help avoid pregnancy. HORMONAL METHODS   Contraceptive implant This is a thin, plastic tube containing progesterone hormone. It does not contain estrogen hormone. Your health care provider inserts the tube in the inner part of the upper arm. The tube can remain in place for up to 3 years. After 3 years, the implant must be removed. The implant prevents the ovaries from releasing an egg (ovulation), thickens the cervical mucus to prevent sperm from entering the uterus, and thins the lining of the inside of the uterus.  Progesterone-only injections These injections are given every 3 months by your health care provider to prevent pregnancy. This synthetic progesterone hormone stops the ovaries from releasing eggs. It also thickens cervical mucus and changes the uterine lining. This makes it harder for sperm to survive in the uterus.  Birth control pills These pills contain estrogen and progesterone hormone. They work by preventing the ovaries from releasing eggs (ovulation). They also cause the cervical mucus to thicken, preventing the sperm from entering the uterus. Birth control pills are prescribed by a health care provider.Birth control pills can also be used to treat heavy periods.  Minipill This type of birth control pill contains only the progesterone hormone. They are taken every day of each month and must be prescribed by your health care provider.  Birth control patch The patch contains hormones similar to those in birth control pills. It must be changed once a week and is prescribed by a health care provider.  Vaginal ring The ring contains hormones similar to those in birth control pills. It is left in the vagina for 3 weeks, removed for 1 week, and then a new one is put back in place. The patient must be comfortable inserting and removing the ring from  the vagina.A health care provider's prescription is necessary.  Emergency contraception Emergency contraceptives prevent pregnancy after unprotected sexual intercourse. This pill can be taken right after sex or up to 5 days after unprotected sex. It is most effective the sooner you take the pills after having sexual intercourse. Most emergency contraceptive pills are available without a prescription. Check with your pharmacist. Do not use emergency contraception as your only form of birth control. BARRIER METHODS   Female condom This is a thin sheath (latex or rubber) that is worn over the penis during sexual intercourse. It can be used with spermicide to increase effectiveness.  Female condom. This is a soft, loose-fitting sheath that is put into the vagina before sexual intercourse.  Diaphragm This is a soft, latex, dome-shaped barrier that must be fitted by a health care provider. It is inserted into the vagina, along with a spermicidal jelly. It is inserted before intercourse. The diaphragm should be left in the vagina for 6 to 8 hours after intercourse.  Cervical cap This is a round, soft, latex or plastic cup that fits over the cervix and must be fitted by a health care provider. The cap can be left in place for up to 48 hours after intercourse.  Sponge This is a soft, circular piece of polyurethane foam. The sponge has spermicide in it. It is inserted into the vagina after wetting it and before sexual intercourse.  Spermicides These are chemicals that kill or block sperm from entering the cervix and uterus. They come in the form of creams, jellies, suppositories, foam, or tablets. They do not require a   prescription. They are inserted into the vagina with an applicator before having sexual intercourse. The process must be repeated every time you have sexual intercourse. INTRAUTERINE CONTRACEPTION  Intrauterine device (IUD) This is a T-shaped device that is put in a woman's uterus during a  menstrual period to prevent pregnancy. There are 2 types:  Copper IUD This type of IUD is wrapped in copper wire and is placed inside the uterus. Copper makes the uterus and fallopian tubes produce a fluid that kills sperm. It can stay in place for 10 years.  Hormone IUD This type of IUD contains the hormone progestin (synthetic progesterone). The hormone thickens the cervical mucus and prevents sperm from entering the uterus, and it also thins the uterine lining to prevent implantation of a fertilized egg. The hormone can weaken or kill the sperm that get into the uterus. It can stay in place for 3 5 years, depending on which type of IUD is used. PERMANENT METHODS OF CONTRACEPTION  Female tubal ligation This is when the woman's fallopian tubes are surgically sealed, tied, or blocked to prevent the egg from traveling to the uterus.  Hysteroscopic sterilization This involves placing a small coil or insert into each fallopian tube. Your doctor uses a technique called hysteroscopy to do the procedure. The device causes scar tissue to form. This results in permanent blockage of the fallopian tubes, so the sperm cannot fertilize the egg. It takes about 3 months after the procedure for the tubes to become blocked. You must use another form of birth control for these 3 months.  Female sterilization This is when the female has the tubes that carry sperm tied off (vasectomy).This blocks sperm from entering the vagina during sexual intercourse. After the procedure, the man can still ejaculate fluid (semen). NATURAL PLANNING METHODS  Natural family planning This is not having sexual intercourse or using a barrier method (condom, diaphragm, cervical cap) on days the woman could become pregnant.  Calendar method This is keeping track of the length of each menstrual cycle and identifying when you are fertile.  Ovulation method This is avoiding sexual intercourse during ovulation.  Symptothermal method This is  avoiding sexual intercourse during ovulation, using a thermometer and ovulation symptoms.  Post ovulation method This is timing sexual intercourse after you have ovulated. Regardless of which type or method of contraception you choose, it is important that you use condoms to protect against the transmission of sexually transmitted infections (STIs). Talk with your health care provider about which form of contraception is most appropriate for you. Document Released: 11/10/2005 Document Revised: 07/13/2013 Document Reviewed: 05/05/2013 [employer] Patient Information 2014 [employer]. Health Maintenance, Female A healthy lifestyle and preventative care can promote health and wellness.  Maintain regular health, dental, and eye exams.  Eat a healthy diet. Foods like vegetables, fruits, whole grains, low-fat dairy products, and lean protein foods contain the nutrients you need without too many calories. Decrease your intake of foods high in solid fats, added sugars, and salt. Get information about a proper diet from your caregiver, if necessary.  Regular physical exercise is one of the most important things you can do for your health. Most adults should get at least 150 minutes of moderate-intensity exercise (any activity that increases your heart rate and causes you to sweat) each week. In addition, most adults need muscle-strengthening exercises on 2 or more days a week.   Maintain a healthy weight. The body mass index (BMI) is a screening tool to identify possible weight   problems. It provides an estimate of body fat based on height and weight. Your caregiver can help determine your BMI, and can help you achieve or maintain a healthy weight. For adults 20 years and older:  A BMI below 18.5 is considered underweight.  A BMI of 18.5 to 24.9 is normal.  A BMI of 25 to 29.9 is considered overweight.  A BMI of 30 and above is considered obese.  Maintain normal blood lipids and cholesterol by  exercising and minimizing your intake of saturated fat. Eat a balanced diet with plenty of fruits and vegetables. Blood tests for lipids and cholesterol should begin at age 20 and be repeated every 5 years. If your lipid or cholesterol levels are high, you are over 50, or you are a high risk for heart disease, you may need your cholesterol levels checked more frequently.Ongoing high lipid and cholesterol levels should be treated with medicines if diet and exercise are not effective.  If you smoke, find out from your caregiver how to quit. If you do not use tobacco, do not start.  Lung cancer screening is recommended for adults aged 55 80 years who are at high risk for developing lung cancer because of a history of smoking. Yearly low-dose computed tomography (CT) is recommended for people who have at least a 30-pack-year history of smoking and are a current smoker or have quit within the past 15 years. A pack year of smoking is smoking an average of 1 pack of cigarettes a day for 1 year (for example: 1 pack a day for 30 years or 2 packs a day for 15 years). Yearly screening should continue until the smoker has stopped smoking for at least 15 years. Yearly screening should also be stopped for people who develop a health problem that would prevent them from having lung cancer treatment.  If you are pregnant, do not drink alcohol. If you are breastfeeding, be very cautious about drinking alcohol. If you are not pregnant and choose to drink alcohol, do not exceed 1 drink per day. One drink is considered to be 12 ounces (355 mL) of beer, 5 ounces (148 mL) of wine, or 1.5 ounces (44 mL) of liquor.  Avoid use of street drugs. Do not share needles with anyone. Ask for help if you need support or instructions about stopping the use of drugs.  High blood pressure causes heart disease and increases the risk of stroke. Blood pressure should be checked at least every 1 to 2 years. Ongoing high blood pressure should be  treated with medicines, if weight loss and exercise are not effective.  If you are 55 to 22 years old, ask your caregiver if you should take aspirin to prevent strokes.  Diabetes screening involves taking a blood sample to check your fasting blood sugar level. This should be done once every 3 years, after age 45, if you are within normal weight and without risk factors for diabetes. Testing should be considered at a younger age or be carried out more frequently if you are overweight and have at least 1 risk factor for diabetes.  Breast cancer screening is essential preventative care for women. You should practice "breast self-awareness." This means understanding the normal appearance and feel of your breasts and may include breast self-examination. Any changes detected, no matter how small, should be reported to a caregiver. Women in their 20s and 30s should have a clinical breast exam (CBE) by a caregiver as part of a regular health exam every   1 to 3 years. After age 40, women should have a CBE every year. Starting at age 40, women should consider having a mammogram (breast X-ray) every year. Women who have a family history of breast cancer should talk to their caregiver about genetic screening. Women at a high risk of breast cancer should talk to their caregiver about having an MRI and a mammogram every year.  Breast cancer gene (BRCA)-related cancer risk assessment is recommended for women who have family members with BRCA-related cancers. BRCA-related cancers include breast, ovarian, tubal, and peritoneal cancers. Having family members with these cancers may be associated with an increased risk for harmful changes (mutations) in the breast cancer genes BRCA1 and BRCA2. Results of the assessment will determine the need for genetic counseling and BRCA1 and BRCA2 testing.  The Pap test is a screening test for cervical cancer. Women should have a Pap test starting at age 21. Between ages 21 and 29, Pap  tests should be repeated every 2 years. Beginning at age 30, you should have a Pap test every 3 years as long as the past 3 Pap tests have been normal. If you had a hysterectomy for a problem that was not cancer or a condition that could lead to cancer, then you no longer need Pap tests. If you are between ages 65 and 70, and you have had normal Pap tests going back 10 years, you no longer need Pap tests. If you have had past treatment for cervical cancer or a condition that could lead to cancer, you need Pap tests and screening for cancer for at least 20 years after your treatment. If Pap tests have been discontinued, risk factors (such as a new sexual partner) need to be reassessed to determine if screening should be resumed. Some women have medical problems that increase the chance of getting cervical cancer. In these cases, your caregiver may recommend more frequent screening and Pap tests.  The human papillomavirus (HPV) test is an additional test that may be used for cervical cancer screening. The HPV test looks for the virus that can cause the cell changes on the cervix. The cells collected during the Pap test can be tested for HPV. The HPV test could be used to screen women aged 30 years and older, and should be used in women of any age who have unclear Pap test results. After the age of 30, women should have HPV testing at the same frequency as a Pap test.  Colorectal cancer can be detected and often prevented. Most routine colorectal cancer screening begins at the age of 50 and continues through age 75. However, your caregiver may recommend screening at an earlier age if you have risk factors for colon cancer. On a yearly basis, your caregiver may provide home test kits to check for hidden blood in the stool. Use of a small camera at the end of a tube, to directly examine the colon (sigmoidoscopy or colonoscopy), can detect the earliest forms of colorectal cancer. Talk to your caregiver about this at  age 50, when routine screening begins. Direct examination of the colon should be repeated every 5 to 10 years through age 75, unless early forms of pre-cancerous polyps or small growths are found.  Hepatitis C blood testing is recommended for all people born from 1945 through 1965 and any individual with known risks for hepatitis C.  Practice safe sex. Use condoms and avoid high-risk sexual practices to reduce the spread of sexually transmitted infections (STIs). Sexually active   women aged 25 and younger should be checked for Chlamydia, which is a common sexually transmitted infection. Older women with new or multiple partners should also be tested for Chlamydia. Testing for other STIs is recommended if you are sexually active and at increased risk.  Osteoporosis is a disease in which the bones lose minerals and strength with aging. This can result in serious bone fractures. The risk of osteoporosis can be identified using a bone density scan. Women ages 65 and over and women at risk for fractures or osteoporosis should discuss screening with their caregivers. Ask your caregiver whether you should be taking a calcium supplement or vitamin D to reduce the rate of osteoporosis.  Menopause can be associated with physical symptoms and risks. Hormone replacement therapy is available to decrease symptoms and risks. You should talk to your caregiver about whether hormone replacement therapy is right for you.  Use sunscreen. Apply sunscreen liberally and repeatedly throughout the day. You should seek shade when your shadow is shorter than you. Protect yourself by wearing long sleeves, pants, a wide-brimmed hat, and sunglasses year round, whenever you are outdoors.  Notify your caregiver of new moles or changes in moles, especially if there is a change in shape or color. Also notify your caregiver if a mole is larger than the size of a pencil eraser.  Stay current with your immunizations. Document Released:  05/26/2011 Document Revised: 03/07/2013 Document Reviewed: 05/26/2011 [employer] Patient Information 2014 [employer].  

## 2013-11-29 ENCOUNTER — Telehealth: Payer: Self-pay | Admitting: *Deleted

## 2013-12-01 ENCOUNTER — Encounter: Payer: Medicaid Other | Admitting: *Deleted

## 2013-12-15 ENCOUNTER — Encounter: Payer: Medicaid Other | Attending: Obstetrics & Gynecology | Admitting: *Deleted

## 2013-12-15 ENCOUNTER — Encounter: Payer: Self-pay | Admitting: *Deleted

## 2013-12-15 DIAGNOSIS — Z681 Body mass index (BMI) 19 or less, adult: Secondary | ICD-10-CM | POA: Insufficient documentation

## 2013-12-15 DIAGNOSIS — R636 Underweight: Secondary | ICD-10-CM

## 2013-12-15 DIAGNOSIS — Z713 Dietary counseling and surveillance: Secondary | ICD-10-CM | POA: Insufficient documentation

## 2013-12-15 DIAGNOSIS — F5000 Anorexia nervosa, unspecified: Secondary | ICD-10-CM

## 2013-12-15 DIAGNOSIS — F509 Eating disorder, unspecified: Secondary | ICD-10-CM | POA: Insufficient documentation
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
## 2013-12-19 ENCOUNTER — Other Ambulatory Visit: Payer: Self-pay | Admitting: *Deleted

## 2013-12-19 DIAGNOSIS — B379 Candidiasis, unspecified: Secondary | ICD-10-CM

## 2013-12-19 MED ORDER — FLUCONAZOLE 150 MG PO TABS: 150.0000 mg | ORAL_TABLET | Freq: Once | ORAL | Status: DC

## 2013-12-23 ENCOUNTER — Encounter: Payer: Medicaid Other | Admitting: *Deleted

## 2013-12-23 VITALS — Ht 63.0 in | Wt 98.6 lb

## 2013-12-23 DIAGNOSIS — E441 Mild protein-calorie malnutrition: Secondary | ICD-10-CM

## 2013-12-23 DIAGNOSIS — F5000 Anorexia nervosa, unspecified: Secondary | ICD-10-CM
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
## 2013-12-23 NOTE — Patient Instructions (Signed)
Aim for 3 meals each day On work days have protein bar or breakfast shake or something else easy Each meal aim for starch, protein, and fruit or vegetable Aim for 4 cups non-caffeinated beverages Take multivitamin and calcium/vitamin D

## 2013-12-26 ENCOUNTER — Ambulatory Visit (INDEPENDENT_AMBULATORY_CARE_PROVIDER_SITE_OTHER): Payer: Medicaid Other | Admitting: Obstetrics & Gynecology

## 2013-12-26 VITALS — BP 98/62 | HR 73 | Temp 98.2°F | Wt 102.0 lb

## 2013-12-26 DIAGNOSIS — Z3202 Encounter for pregnancy test, result negative: Secondary | ICD-10-CM

## 2013-12-26 DIAGNOSIS — N949 Unspecified condition associated with female genital organs and menstrual cycle: Secondary | ICD-10-CM

## 2013-12-26 DIAGNOSIS — Z113 Encounter for screening for infections with a predominantly sexual mode of transmission: Secondary | ICD-10-CM

## 2013-12-26 DIAGNOSIS — R102 Pelvic and perineal pain: Secondary | ICD-10-CM

## 2013-12-26 LAB — POCT URINALYSIS DIPSTICK
BILIRUBIN UA: NEGATIVE
GLUCOSE UA: NEGATIVE
KETONES UA: NEGATIVE
Leukocytes, UA: NEGATIVE
Nitrite, UA: NEGATIVE
RBC UA: NEGATIVE
SPEC GRAV UA: 1.015
UROBILINOGEN UA: NEGATIVE
pH, UA: 5

## 2013-12-26 LAB — POCT URINE PREGNANCY: Preg Test, Ur: NEGATIVE

## 2013-12-26 MED ORDER — CITALOPRAM HYDROBROMIDE 20 MG PO TABS: 20.0000 mg | ORAL_TABLET | Freq: Every day | ORAL | Status: DC

## 2013-12-26 NOTE — Progress Notes (Signed)
Subjective:     Beverly Myers is a 23 y.o. female here for a routine exam.  Current complaints: pt states that she is having some pelvic pain for the past week. Pt describes as sharp pain in lower pelvic region.  Pt denies association with menstral cycles. Pt will notice it more when she is at work.  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  Personal health questionnaire reviewed: yes.   Gynecologic History No LMP recorded. Contraception: none, abstinance Last Pap: 10/2013. Results were: normal Last mammogram: n/a  Obstetric History OB History  Gravida Para Term Preterm AB SAB TAB Ectopic Multiple Living  1 1 1  0 0 0 0 0 0 1    # Outcome Date GA Lbr Len/2nd Weight Sex Delivery Anes PTL Lv  1 TRM 06/09/12 [id number] 03:48 / 02:25 7 lb 14.1 oz (3.575 kg) M SVD EPI  Y        Review of Systems Pertinent items are noted in HPI.  Objective:    Abdomen: soft, non-tender; bowel sounds normal; no masses,  no organomegaly Pelvic: cervix normal in appearance, external genitalia normal, no adnexal masses or tenderness, no cervical motion tenderness, rectovaginal septum normal, uterus normal size, shape, and consistency and vagina normal without discharge   Informal pelvic U/S wnl Assessment:   Subacute pain--?Mittelschmerz  Plan:   Follow up in  1 month Orders Placed This Encounter  Procedures  . GC/Chlamydia Probe Amp  . WET PREP BY MOLECULAR PROBE  . Urine culture  . Urinalysis, Routine w reflex microscopic  . POCT urinalysis dipstick  . POCT urine pregnancy

## 2013-12-27 LAB — URINALYSIS, ROUTINE W REFLEX MICROSCOPIC
BILIRUBIN URINE: NEGATIVE
Glucose, UA: NEGATIVE mg/dL
HGB URINE DIPSTICK: NEGATIVE
KETONES UR: NEGATIVE mg/dL
Leukocytes, UA: NEGATIVE
NITRITE: NEGATIVE
PROTEIN: NEGATIVE mg/dL
Specific Gravity, Urine: 1.017 (ref 1.005–1.030)
UROBILINOGEN UA: 0.2 mg/dL (ref 0.0–1.0)
pH: 6.5 (ref 5.0–8.0)

## 2013-12-27 LAB — WET PREP BY MOLECULAR PROBE
CANDIDA SPECIES: NEGATIVE
GARDNERELLA VAGINALIS: NEGATIVE
TRICHOMONAS VAG: NEGATIVE

## 2013-12-27 LAB — GC/CHLAMYDIA PROBE AMP
CT PROBE, AMP APTIMA: NEGATIVE
GC PROBE AMP APTIMA: NEGATIVE

## 2013-12-28 ENCOUNTER — Encounter: Payer: Self-pay | Admitting: Obstetrics & Gynecology

## 2013-12-28 LAB — URINE CULTURE
Colony Count: NO GROWTH
Organism ID, Bacteria: NO GROWTH

## 2013-12-28 NOTE — Patient Instructions (Signed)
Pelvic Pain, Female °Female pelvic pain can be caused by many different things and start from a variety of places. Pelvic pain refers to pain that is located in the lower half of the abdomen and between your hips. The pain may occur over a short period of time (acute) or may be reoccurring (chronic). The cause of pelvic pain may be related to disorders affecting the female reproductive organs (gynecologic), but it may also be related to the bladder, kidney stones, an intestinal complication, or muscle or skeletal problems. Getting help right away for pelvic pain is important, especially if there has been severe, sharp, or a sudden onset of unusual pain. It is also important to get help right away because some types of pelvic pain can be life threatening.  °CAUSES  °Below are only some of the causes of pelvic pain. The causes of pelvic pain can be in one of several categories.  °· Gynecologic. °· Pelvic inflammatory disease. °· Sexually transmitted infection. °· Ovarian cyst or a twisted ovarian ligament (ovarian torsion). °· Uterine lining that grows outside the uterus (endometriosis). °· Fibroids, cysts, or tumors. °· Ovulation. °· Pregnancy. °· Pregnancy that occurs outside the uterus (ectopic pregnancy). °· Miscarriage. °· Labor. °· Abruption of the placenta or ruptured uterus. °· Infection. °· Uterine infection (endometritis). °· Bladder infection. °· Diverticulitis. °· Miscarriage related to a uterine infection (septic abortion). °· Bladder. °· Inflammation of the bladder (cystitis). °· Kidney stone(s). °· Gastrointenstinal. °· Constipation. °· Diverticulitis. °· Neurologic. °· Trauma. °· Feeling pelvic pain because of mental or emotional causes (psychosomatic). °· Cancers of the bowel or pelvis. °EVALUATION  °Your caregiver will want to take a careful history of your concerns. This includes recent changes in your health, a careful gynecologic history of your periods (menses), and a sexual history. Obtaining  your family history and medical history is also important. Your caregiver may suggest a pelvic exam. A pelvic exam will help identify the location and severity of the pain. It also helps in the evaluation of which organ system may be involved. In order to identify the cause of the pelvic pain and be properly treated, your caregiver may order tests. These tests may include:  °· A pregnancy test. °· Pelvic ultrasonography. °· An X-ray exam of the abdomen. °· A urinalysis or evaluation of vaginal discharge. °· Blood tests. °HOME CARE INSTRUCTIONS  °· Only take over-the-counter or prescription medicines for pain, discomfort, or fever as directed by your caregiver.   °· Rest as directed by your caregiver.   °· Eat a balanced diet.   °· Drink enough fluids to make your urine clear or pale yellow, or as directed.   °· Avoid sexual intercourse if it causes pain.   °· Apply warm or cold compresses to the lower abdomen depending on which one helps the pain.   °· Avoid stressful situations.   °· Keep a journal of your pelvic pain. Write down when it started, where the pain is located, and if there are things that seem to be associated with the pain, such as food or your menstrual cycle. °· Follow up with your caregiver as directed.   °SEEK MEDICAL CARE IF: °· Your medicine does not help your pain. °· You have abnormal vaginal discharge. °SEEK IMMEDIATE MEDICAL CARE IF:  °· You have heavy bleeding from the vagina.   °· Your pelvic pain increases.   °· You feel lightheaded or faint.   °· You have chills.   °· You have pain with urination or blood in your urine.   °· You have uncontrolled   diarrhea or vomiting.   °· You have a fever or persistent symptoms for more than 3 days. °· You have a fever and your symptoms suddenly get worse.   °· You are being physically or sexually abused.   °MAKE SURE YOU: °· Understand these instructions. °· Will watch your condition. °· Will get help if you are not doing well or get worse. °Document  Released: 10/07/2004 Document Revised: 05/11/2012 Document Reviewed: 03/01/2012 °ExitCare® Patient Information ©2014 [employer]. ° °

## 2013-12-29 ENCOUNTER — Ambulatory Visit: Payer: Medicaid Other | Admitting: Dietician

## 2014-01-05 ENCOUNTER — Encounter: Payer: Medicaid Other | Attending: Obstetrics & Gynecology | Admitting: *Deleted

## 2014-01-05 DIAGNOSIS — F509 Eating disorder, unspecified: Secondary | ICD-10-CM | POA: Insufficient documentation

## 2014-01-05 DIAGNOSIS — F5000 Anorexia nervosa, unspecified: Secondary | ICD-10-CM

## 2014-01-05 DIAGNOSIS — Z681 Body mass index (BMI) 19 or less, adult: Secondary | ICD-10-CM | POA: Insufficient documentation

## 2014-01-05 DIAGNOSIS — Z713 Dietary counseling and surveillance: Secondary | ICD-10-CM | POA: Insufficient documentation

## 2014-01-05 DIAGNOSIS — R636 Underweight: Secondary | ICD-10-CM

## 2014-01-05 NOTE — Patient Instructions (Signed)
Breakfast: meal replacement beverages: Fiber One bar or [employer] Protein bar or [employer] Breakfast Essentials, egg muffin (bake in oven) Lunch: whatever you want Dinner: hotpockets with edamame; [employer] meals; pasta with sauce; rotisserie chicken from [employer] (on special on sundays) or ribs on special on saturdays.  With bag frozen veggies; frozen pizza.  If you get something for [employer], drink a [employer] Breakfast Essentials  Aim for 5 cups of water If struggling add Miralax or senna

## 2014-01-05 NOTE — Progress Notes (Signed)
Patient was seen on 01/05/14 for nutrition counseling pertaining to disordered eating  Primary care MD: none.  Beverly Myers is OB/GYN Therapist: [facility].  Beverly Myers for about a month Any other medical team members: none.  Knows she needs another medical provider to prescribe anti-anxiety medications  Assessment Weight: 102lb Height: 63 Expected body weight: 115 Percent expected body weight: 89%   Mental health diagnosis: meets criteria for AN, type 1   Medical Information:  Changes in hair, skin, nails since ED started: hair dry, nails have always been thin Chewing/swallowing difficulties: none Relux or heartburn: none Trouble with teeth: none.  Hates going to the dentist.  Hasn't been in years. LMP without the use of hormones: 12/08/13; they are irregular   Effect of exercise on menses: NA  Doesn't exercise    Constipation, diarrhea: diarrhea.  BM q3d  Dietary assessment  A typical day consists of 2-653meals and 0 snacks.  She's currently eating.  She cycles through episodes of eating and not eating  Safe foods include: spicy foods (loves hot sauce), pizza, pasta Avoided foods include:none  24 hour recall: she is trying to eat breakfast in the morning, but she hasn't found one she likes.  Today had cinnamon pancakes and bacon.  Hasn't had lunch yet, but plans to eat those leftovers for "lunch" and then will eat dinner  B: waffle and eggs L: pretzel bites from the mall and a smoothie.  Her friend added whey protein which she called a weight gainer.  That causes Beverly Myers anxiety and did chose not to add the whey protein D: hotpocket and mac-n-cheese Beverages: water, sweet tea, sprit  experimenting with more starches: tortilla chips with guac.  actually bought avacado.  Got loaded baked potatoes and ate 2 in one day.she bought some biscuits.  Bought asparagus, and tomatoes to make salsa.   Compensatory behaviors           Restricting (calories, fat, carbs):  doesn't limit any type of food, but when she gains weight, she skips meals  SIV: denies  Diet pills: denies  Laxatives: denies  Diuretics: denies  Alcohol or drugs: denies.  Very rarely  Exercise (what type): none  Food rules or rituals (explain)  Binge: denies   Nutrition Diagnosis: NI-1.4 Inadequate energy intake As related to restrictive eating due to eating disorder.  As evidenced by BMI of 17.9  Intervention/Goals: Aim for 3 meals each day On work days have protein bar or breakfast shake or something else easy Each meal aim for starch, protein, and fruit or vegetable Aim for 4 cups non-caffeinated beverages Take multivitamin and calcium/vitamin D  [facility]  Monitoring and Evaluation: Patient will follow up in 2 weeks.

## 2014-01-19 ENCOUNTER — Ambulatory Visit: Payer: Medicaid Other | Admitting: *Deleted

## 2014-01-20 ENCOUNTER — Ambulatory Visit: Payer: Medicaid Other | Admitting: *Deleted

## 2014-01-26 ENCOUNTER — Ambulatory Visit: Payer: Medicaid Other | Admitting: Obstetrics & Gynecology

## 2014-02-01 ENCOUNTER — Ambulatory Visit: Payer: Medicaid Other | Admitting: Obstetrics & Gynecology

## 2014-03-27 ENCOUNTER — Ambulatory Visit (INDEPENDENT_AMBULATORY_CARE_PROVIDER_SITE_OTHER): Payer: Medicaid Other | Admitting: Obstetrics & Gynecology

## 2014-03-27 ENCOUNTER — Encounter: Payer: Self-pay | Admitting: Obstetrics & Gynecology

## 2014-03-27 VITALS — BP 98/75 | HR 90 | Temp 97.6°F | Ht 63.0 in | Wt 99.0 lb

## 2014-03-27 DIAGNOSIS — Z3009 Encounter for other general counseling and advice on contraception: Secondary | ICD-10-CM

## 2014-03-27 LAB — POCT URINE PREGNANCY: Preg Test, Ur: NEGATIVE

## 2014-03-27 MED ORDER — LEVONORGESTREL 1.5 MG PO TABS: 1.5000 mg | ORAL_TABLET | Freq: Once | ORAL | Status: DC

## 2014-03-27 NOTE — Addendum Note (Signed)
Addended by: Odessa FlemingBOHNE, Laney Bagshaw M on: 03/27/2014 03:58 PM   Modules accepted: Orders

## 2014-03-27 NOTE — Patient Instructions (Signed)
Etonogestrel implant What is this medicine? ETONOGESTREL (et oh noe JES trel) is a contraceptive (birth control) device. It is used to prevent pregnancy. It can be used for up to 3 years. This medicine may be used for other purposes; ask your health care provider or pharmacist if you have questions. COMMON BRAND NAME(S): Implanon, Nexplanon  What should I tell my health care provider before I take this medicine? They need to know if you have any of these conditions: -abnormal vaginal bleeding -blood vessel disease or blood clots -cancer of the breast, cervix, or liver -depression -diabetes -gallbladder disease -headaches -heart disease or recent heart attack -high blood pressure -high cholesterol -kidney disease -liver disease -renal disease -seizures -tobacco smoker -an unusual or allergic reaction to etonogestrel, other hormones, anesthetics or antiseptics, medicines, foods, dyes, or preservatives -pregnant or trying to get pregnant -breast-feeding How should I use this medicine? This device is inserted just under the skin on the inner side of your upper arm by a health care professional. Talk to your pediatrician regarding the use of this medicine in children. Special care may be needed. Overdosage: If you think you've taken too much of this medicine contact a poison control center or emergency room at once. Overdosage: If you think you have taken too much of this medicine contact a poison control center or emergency room at once. NOTE: This medicine is only for you. Do not share this medicine with others. What if I miss a dose? This does not apply. What may interact with this medicine? Do not take this medicine with any of the following medications: -amprenavir -bosentan -fosamprenavir This medicine may also interact with the following medications: -barbiturate medicines for inducing sleep or treating seizures -certain medicines for fungal infections like ketoconazole and  itraconazole -griseofulvin -medicines to treat seizures like carbamazepine, felbamate, oxcarbazepine, phenytoin, topiramate -modafinil -phenylbutazone -rifampin -some medicines to treat HIV infection like atazanavir, indinavir, lopinavir, nelfinavir, tipranavir, ritonavir -St. John's wort This list may not describe all possible interactions. Give your health care provider a list of all the medicines, herbs, non-prescription drugs, or dietary supplements you use. Also tell them if you smoke, drink alcohol, or use illegal drugs. Some items may interact with your medicine. What should I watch for while using this medicine? This product does not protect you against HIV infection (AIDS) or other sexually transmitted diseases. You should be able to feel the implant by pressing your fingertips over the skin where it was inserted. Tell your doctor if you cannot feel the implant. What side effects may I notice from receiving this medicine? Side effects that you should report to your doctor or health care professional as soon as possible: -allergic reactions like skin rash, itching or hives, swelling of the face, lips, or tongue -breast lumps -changes in vision -confusion, trouble speaking or understanding -dark urine -depressed mood -general ill feeling or flu-like symptoms -light-colored stools -loss of appetite, nausea -right upper belly pain -severe headaches -severe pain, swelling, or tenderness in the abdomen -shortness of breath, chest pain, swelling in a leg -signs of pregnancy -sudden numbness or weakness of the face, arm or leg -trouble walking, dizziness, loss of balance or coordination -unusual vaginal bleeding, discharge -unusually weak or tired -yellowing of the eyes or skin Side effects that usually do not require medical attention (Report these to your doctor or health care professional if they continue or are bothersome.): -acne -breast pain -changes in  weight -cough -fever or chills -headache -irregular menstrual bleeding -itching, burning,   and vaginal discharge -pain or difficulty passing urine -sore throat This list may not describe all possible side effects. Call your doctor for medical advice about side effects. You may report side effects to FDA at 1-800-FDA-1088. Where should I keep my medicine? This drug is given in a hospital or clinic and will not be stored at home. NOTE: This sheet is a summary. It may not cover all possible information. If you have questions about this medicine, talk to your doctor, pharmacist, or health care provider.  2014, Elsevier/Gold Standard. (2012-05-17 15:37:45)  

## 2014-03-27 NOTE — Addendum Note (Signed)
Addended by: Elby BeckPAUL, JANE F on: 03/27/2014 04:00 PM   Modules accepted: Orders

## 2014-03-27 NOTE — Progress Notes (Signed)
Subjective:    Beverly Myers is a 23 y.o. female who presents for contraception counseling. The patient reports unprotected intercourse 2 days ago.  Pertinent past medical history: none.  Menstrual History: OB History   Grav Para Term Preterm Abortions TAB SAB Ect Mult Living   1 1 1  0 0 0 0 0 0 1       Patient's last menstrual period was 02/22/2014.   There are no active problems to display for this patient.  Past Medical History  Diagnosis Date  . Cellulitis 2012    in right foot  . UTI (urinary tract infection)     Past Surgical History  Procedure Laterality Date  . Tonsilectomy, adenoidectomy, bilateral myringotomy and tubes      No current outpatient prescriptions on file. No Known Allergies  History  Substance Use Topics  . Smoking status: Current Every Day Smoker  . Smokeless tobacco: Never Used     Comment: 3-5 cigarretts per day  . Alcohol Use: Yes     Comment: socially    Family History  Problem Relation Age of Onset  . Cancer Paternal Grandmother        Review of Systems Constitutional: negative for weight loss Genitourinary:negative for abnormal menstrual periods and vaginal discharge   Objective:   BP 98/75  Pulse 90  Temp(Src) 97.6 F (36.4 C)  Ht 5\' 3"  (1.6 m)  Wt 44.906 kg (99 lb)  BMI 17.54 kg/m2  LMP 02/22/2014  Breastfeeding? No     Lab Review Urine pregnancy test Labs reviewed no Radiologic studies reviewed no  50% of 15 min visit spent on counseling and coordination of care.   Assessment:    23 y.o. plans Nexplanon Recent unprotected intercourse  Plan:  Emergency contraception/pt education materials provided re: Nexplanon  All questions answered. Follow up in 2 weeks.

## 2014-03-28 ENCOUNTER — Other Ambulatory Visit (INDEPENDENT_AMBULATORY_CARE_PROVIDER_SITE_OTHER): Payer: Medicaid Other | Admitting: *Deleted

## 2014-03-28 VITALS — BP 101/68 | HR 74 | Temp 98.5°F | Ht 63.0 in | Wt 98.0 lb

## 2014-03-28 DIAGNOSIS — N390 Urinary tract infection, site not specified: Secondary | ICD-10-CM

## 2014-03-28 MED ORDER — NITROFURANTOIN MONOHYD MACRO 100 MG PO CAPS
100.0000 mg | ORAL_CAPSULE | Freq: Two times a day (BID) | ORAL | Status: DC
Start: 2014-03-28 — End: 2014-04-13

## 2014-03-28 NOTE — Progress Notes (Signed)
Patient states yesterday she started to have an uncomfortable feeling after using the restroom and that it has since gotten worse. Patient is having some bleeding and thinks it is coming from her bladder. Patient states she is having urgency. Patient denies any frequency. Patient notified that we dipped her urine yesterday and had Dr. Tamela OddiJackson-Moore look at it and Dr. Tamela OddiJackson-Moore said it was fine but that we would go ahead and send it for a urine culture today. Macrobid sent to [facility] on [address]. Patient notified that results would be back in about 48 hours.  BP 101/68  Pulse 74  Temp(Src) 98.5 F (36.9 C)  Ht 5\' 3"  (1.6 m)  Wt 98 lb (44.453 kg)  BMI 17.36 kg/m2  LMP 02/22/2014

## 2014-03-28 NOTE — Addendum Note (Signed)
Addended by: Odessa FlemingBOHNE, Khristen Cheyney M on: 03/28/2014 02:57 PM   Modules accepted: Orders, Level of Service

## 2014-03-29 ENCOUNTER — Other Ambulatory Visit: Payer: Self-pay | Admitting: *Deleted

## 2014-03-29 DIAGNOSIS — B379 Candidiasis, unspecified: Secondary | ICD-10-CM

## 2014-03-29 LAB — URINE CULTURE
Colony Count: NO GROWTH
ORGANISM ID, BACTERIA: NO GROWTH

## 2014-03-29 MED ORDER — FLUCONAZOLE 150 MG PO TABS: 150.0000 mg | ORAL_TABLET | Freq: Once | ORAL | Status: DC

## 2014-04-13 ENCOUNTER — Encounter: Payer: Self-pay | Admitting: Obstetrics & Gynecology

## 2014-04-13 ENCOUNTER — Ambulatory Visit (INDEPENDENT_AMBULATORY_CARE_PROVIDER_SITE_OTHER): Payer: Medicaid Other | Admitting: Obstetrics & Gynecology

## 2014-04-13 VITALS — BP 112/78 | HR 71 | Temp 97.6°F | Ht 63.0 in | Wt 100.0 lb

## 2014-04-13 DIAGNOSIS — Z309 Encounter for contraceptive management, unspecified: Secondary | ICD-10-CM

## 2014-04-13 DIAGNOSIS — Z30017 Encounter for initial prescription of implantable subdermal contraceptive: Secondary | ICD-10-CM

## 2014-04-13 DIAGNOSIS — Z3202 Encounter for pregnancy test, result negative: Secondary | ICD-10-CM

## 2014-04-13 DIAGNOSIS — Z01812 Encounter for preprocedural laboratory examination: Secondary | ICD-10-CM

## 2014-04-13 LAB — POCT URINE PREGNANCY: PREG TEST UR: NEGATIVE

## 2014-04-13 MED ORDER — ETONOGESTREL 68 MG ~~LOC~~ IMPL
68.0000 mg | DRUG_IMPLANT | Freq: Once | SUBCUTANEOUS | Status: AC
  Administered 2014-04-13: 68 mg via SUBCUTANEOUS

## 2014-04-15 NOTE — Progress Notes (Signed)
NEXPLANON INSERTION NOTE      Pregnancy test result:  Lab Results  Component Value Date   PREGTESTUR Negative 04/13/2014    Indications:  The patient desires contraception.  She understands risks, benefits, and alternatives to Implanon and would like to proceed.  Anesthesia:   Lidocaine 1% plain.  Procedure:  A time-out was performed confirming the procedure and the patient's allergy status.  The patient's non-dominant was identified as the left arm.  The protection cap was removed. While placing countertraction on the skin, the needle was inserted at a 30 degree angle.  The applicator was held horizontal to the skin; the skin was tented upward as the needle was introduced into the subdermal space.  While holding the applicator in place, the slider was unlocked. The Nexplanon was removed from the field.  The Nexplanon was palpated to ensure proper placement.  Complications: None  Instructions:  The patient was instructed to remove the dressing in 24 hours and that some bruising is to be expected.  She was advised to use over the counter analgesics as needed for any pain at the site.  She is to keep the area dry for 24 hours and to call if her hand or arm becomes cold, numb, or blue.  Return visit:  Return in 6+ weeks

## 2014-05-25 ENCOUNTER — Ambulatory Visit: Payer: Medicaid Other | Admitting: Obstetrics & Gynecology

## 2014-09-25 ENCOUNTER — Encounter: Payer: Self-pay | Admitting: Obstetrics & Gynecology

## 2014-10-20 ENCOUNTER — Encounter: Payer: Self-pay | Admitting: Obstetrics

## 2014-10-20 ENCOUNTER — Ambulatory Visit (INDEPENDENT_AMBULATORY_CARE_PROVIDER_SITE_OTHER): Payer: Self-pay | Admitting: Obstetrics

## 2014-10-20 ENCOUNTER — Other Ambulatory Visit: Payer: Self-pay | Admitting: Obstetrics

## 2014-10-20 VITALS — BP 119/58 | HR 81 | Wt 102.0 lb

## 2014-10-20 DIAGNOSIS — N760 Acute vaginitis: Secondary | ICD-10-CM

## 2014-10-20 DIAGNOSIS — A499 Bacterial infection, unspecified: Secondary | ICD-10-CM

## 2014-10-20 DIAGNOSIS — B9689 Other specified bacterial agents as the cause of diseases classified elsewhere: Secondary | ICD-10-CM

## 2014-10-20 DIAGNOSIS — N898 Other specified noninflammatory disorders of vagina: Secondary | ICD-10-CM

## 2014-10-20 MED ORDER — METRONIDAZOLE 500 MG PO TABS: 500.0000 mg | ORAL_TABLET | Freq: Two times a day (BID) | ORAL | Status: DC

## 2014-10-20 NOTE — Progress Notes (Signed)
Patient ID: Beverly Myers, female   DOB: Jan 06, 1991, 23 y.o.   MRN: [id number]  Chief Complaint  Patient presents with  . Vaginal Discharge    std testing    HPI Beverly Myers is a 23 y.o. female.  Malodorous vaginal discharge.  HPI  Past Medical History  Diagnosis Date  . Cellulitis 2012    in right foot  . UTI (urinary tract infection)     Past Surgical History  Procedure Laterality Date  . Tonsilectomy, adenoidectomy, bilateral myringotomy and tubes      Family History  Problem Relation Age of Onset  . Cancer Paternal Grandmother     Social History History  Substance Use Topics  . Smoking status: Current Every Day Smoker  . Smokeless tobacco: Never Used     Comment: 3-5 cigarretts per day  . Alcohol Use: Yes     Comment: socially    No Known Allergies  Current Outpatient Prescriptions  Medication Sig Dispense Refill  . metroNIDAZOLE (FLAGYL) 500 MG tablet Take 1 tablet (500 mg total) by mouth 2 (two) times daily. 14 tablet 2   No current facility-administered medications for this visit.    Review of Systems Review of Systems Constitutional: negative for fatigue and weight loss Respiratory: negative for cough and wheezing Cardiovascular: negative for chest pain, fatigue and palpitations Gastrointestinal: negative for abdominal pain and change in bowel habits Genitourinary:negative Integument/breast: negative for nipple discharge Musculoskeletal:negative for myalgias Neurological: negative for gait problems and tremors Behavioral/Psych: negative for abusive relationship, depression Endocrine: negative for temperature intolerance     Blood pressure 119/58, pulse 81, weight 102 lb (46.267 kg).  Physical Exam Physical Exam General:   alert  Skin:   no rash or abnormalities  Lungs:   clear to auscultation bilaterally  Heart:   regular rate and rhythm, S1, S2 normal, no murmur, click, rub or gallop  Breasts:   normal without suspicious masses, skin or  nipple changes or axillary nodes  Abdomen:  normal findings: no organomegaly, soft, non-tender and no hernia  Pelvis:  External genitalia: normal general appearance Urinary system: urethral meatus normal and bladder without fullness, nontender Vaginal: normal without tenderness, induration or masses Cervix: normal appearance Adnexa: normal bimanual exam Uterus: anteverted and non-tender, normal size      Data Reviewed Labs  Assessment    Vaginal Discharge, probably BV.    Plan    Wet Prep and Cultures ordered.  Orders Placed This Encounter  Procedures  . WET PREP BY MOLECULAR PROBE  . GC/Chlamydia Probe Amp   Meds ordered this encounter  Medications  . metroNIDAZOLE (FLAGYL) 500 MG tablet    Sig: Take 1 tablet (500 mg total) by mouth 2 (two) times daily.    Dispense:  14 tablet    Refill:  2        HARPER,CHARLES A 10/20/2014, 2:58 PM

## 2014-10-21 LAB — WET PREP BY MOLECULAR PROBE
CANDIDA SPECIES: NEGATIVE
Gardnerella vaginalis: POSITIVE — AB
TRICHOMONAS VAG: NEGATIVE

## 2014-10-23 LAB — GC/CHLAMYDIA PROBE AMP
CT PROBE, AMP APTIMA: POSITIVE — AB
GC Probe RNA: NEGATIVE

## 2014-10-24 ENCOUNTER — Other Ambulatory Visit: Payer: Self-pay | Admitting: Obstetrics

## 2014-10-24 DIAGNOSIS — A5609 Other chlamydial infection of lower genitourinary tract: Secondary | ICD-10-CM

## 2014-10-24 MED ORDER — CEFIXIME 400 MG PO TABS: 400.0000 mg | ORAL_TABLET | Freq: Every day | ORAL | Status: DC

## 2014-10-24 MED ORDER — AZITHROMYCIN 250 MG PO TABS: 1000.0000 mg | ORAL_TABLET | Freq: Once | ORAL | Status: DC

## 2014-10-25 ENCOUNTER — Ambulatory Visit: Payer: Medicaid Other | Admitting: Obstetrics & Gynecology

## 2014-10-27 ENCOUNTER — Telehealth: Payer: Self-pay | Admitting: *Deleted

## 2014-10-27 NOTE — Telephone Encounter (Signed)
Patient was recently treated for Chlamydia and wanted to know how long after taking her medication should she avoid alcohol. Patient states she took her medication approximately 2 days ago. Patient advised she should be okay to consume alcohol if she wishes. Patient reminded to abstain from intercourse for 2 weeks after treatment.

## 2014-11-20 ENCOUNTER — Encounter: Payer: Self-pay | Admitting: *Deleted

## 2014-11-21 ENCOUNTER — Encounter: Payer: Self-pay | Admitting: Obstetrics & Gynecology

## 2014-12-05 ENCOUNTER — Telehealth: Payer: Self-pay | Admitting: *Deleted

## 2014-12-05 NOTE — Telephone Encounter (Signed)
Pt called to office stating that she thinks she may have chlamydia again.  Would like to know if she can get Rx.  Return call to pt.  Pt states that she has been having increase d/c as did with previous chlamydia infection.  Pt states that she and her partner both were treated.  Pt states that they waited until after 2 weeks passed before resuming intercourse.  Pt states that she thinks her partner is cheating on her and he has not told the other person of positive testing.  Pt states that she thinks that he may be infected and has passed to her again based on her symptoms. Pt advised that it best if she is tested in order to determine infection based on them both being treated and abstaining from intercourse.  Pt states understanding and has appt for nurse/lab visit on 12/06/14.

## 2014-12-06 ENCOUNTER — Other Ambulatory Visit: Payer: Medicaid Other

## 2014-12-06 DIAGNOSIS — Z113 Encounter for screening for infections with a predominantly sexual mode of transmission: Secondary | ICD-10-CM

## 2014-12-06 DIAGNOSIS — A5609 Other chlamydial infection of lower genitourinary tract: Secondary | ICD-10-CM

## 2014-12-06 NOTE — Progress Notes (Unsigned)
Pt not seen by nurse.  Pt here only to leave urine sample for possible chlamydia infection.

## 2014-12-07 LAB — GC/CHLAMYDIA PROBE AMP
CT PROBE, AMP APTIMA: POSITIVE — AB
GC Probe RNA: NEGATIVE

## 2014-12-07 MED ORDER — AZITHROMYCIN 250 MG PO TABS: 1000.0000 mg | ORAL_TABLET | Freq: Once | ORAL | Status: DC

## 2014-12-07 MED ORDER — CEFIXIME 400 MG PO TABS: 400.0000 mg | ORAL_TABLET | Freq: Every day | ORAL | Status: DC

## 2014-12-08 ENCOUNTER — Telehealth: Payer: Self-pay | Admitting: *Deleted

## 2014-12-08 NOTE — Telephone Encounter (Signed)
Spoke with pt regarding lab results.  Made her aware of positive Chlamydia. Azithromycin and Suprax were sent to pt pharmacy.  Pt understands process for treatment and to abstain from intercourse.  Health Dept form sent.

## 2014-12-13 ENCOUNTER — Other Ambulatory Visit: Payer: Self-pay | Admitting: Obstetrics

## 2015-01-30 ENCOUNTER — Ambulatory Visit (INDEPENDENT_AMBULATORY_CARE_PROVIDER_SITE_OTHER): Payer: Medicaid Other | Admitting: Certified Nurse Midwife

## 2015-01-30 ENCOUNTER — Encounter: Payer: Self-pay | Admitting: Certified Nurse Midwife

## 2015-01-30 VITALS — BP 102/68 | HR 93 | Temp 98.1°F | Ht 63.0 in | Wt 109.0 lb

## 2015-01-30 DIAGNOSIS — L678 Other hair color and hair shaft abnormalities: Secondary | ICD-10-CM | POA: Diagnosis not present

## 2015-01-30 DIAGNOSIS — Z113 Encounter for screening for infections with a predominantly sexual mode of transmission: Secondary | ICD-10-CM | POA: Diagnosis not present

## 2015-01-30 LAB — CBC WITH DIFFERENTIAL/PLATELET
BASOS PCT: 0 % (ref 0–1)
Basophils Absolute: 0 10*3/uL (ref 0.0–0.1)
EOS ABS: 0.1 10*3/uL (ref 0.0–0.7)
EOS PCT: 1 % (ref 0–5)
HEMATOCRIT: 40.9 % (ref 36.0–46.0)
HEMOGLOBIN: 13.5 g/dL (ref 12.0–15.0)
LYMPHS ABS: 1.6 10*3/uL (ref 0.7–4.0)
LYMPHS PCT: 15 % (ref 12–46)
MCH: 30.8 pg (ref 26.0–34.0)
MCHC: 33 g/dL (ref 30.0–36.0)
MCV: 93.2 fL (ref 78.0–100.0)
MONO ABS: 0.9 10*3/uL (ref 0.1–1.0)
MONOS PCT: 9 % (ref 3–12)
MPV: 9.6 fL (ref 8.6–12.4)
NEUTROS ABS: 7.8 10*3/uL — AB (ref 1.7–7.7)
NEUTROS PCT: 75 % (ref 43–77)
PLATELETS: 288 10*3/uL (ref 150–400)
RBC: 4.39 MIL/uL (ref 3.87–5.11)
RDW: 13.3 % (ref 11.5–15.5)
WBC: 10.4 10*3/uL (ref 4.0–10.5)

## 2015-01-30 LAB — TSH: TSH: 0.475 u[IU]/mL (ref 0.350–4.500)

## 2015-01-30 NOTE — Progress Notes (Signed)
Patient ID: Beverly Myers, female   DOB: September 29, 1991, 24 y.o.   MRN: [id number]   Chief Complaint  Patient presents with  . STD Check    HPI Beverly Myers is a 24 y.o. female.  Desires full STD check d/t possible reinfection.  Also, has complaints of brittle hair since around August.  Had nexplanon inserted last May.   No menses since Nexplanon insertion.  Denies any recent anorexia or bulemia. Has changed hair products, denies any recent dying/hair trauma, states she is eating better and taking PNV. She is very distressed over her hair loss.  Says she can run fingers through her hair and hair breaks off.   HPI  Past Medical History  Diagnosis Date  . Cellulitis 2012    in right foot  . UTI (urinary tract infection)     Past Surgical History  Procedure Laterality Date  . Tonsilectomy, adenoidectomy, bilateral myringotomy and tubes      Family History  Problem Relation Age of Onset  . Cancer Paternal Grandmother     Social History History  Substance Use Topics  . Smoking status: Current Some Day Smoker  . Smokeless tobacco: Never Used     Comment: 3-5 cigarretts per day  . Alcohol Use: Yes     Comment: socially    No Known Allergies  No current outpatient prescriptions on file.   No current facility-administered medications for this visit.    Review of Systems Review of Systems Constitutional: negative for fatigue and weight loss Respiratory: negative for cough and wheezing Cardiovascular: negative for chest pain, fatigue and palpitations Gastrointestinal: negative for abdominal pain and change in bowel habits Genitourinary:negative Integument/breast: negative for nipple discharge Musculoskeletal:negative for myalgias Neurological: negative for gait problems and tremors Behavioral/Psych: negative for abusive relationship, depression Endocrine: negative for temperature intolerance, + hair loss     Blood pressure 102/68, pulse 93, temperature 98.1 F (36.7 C),  height 5\' 3"  (1.6 m), weight 49.442 kg (109 lb).  Physical Exam Physical Exam General:   alert  Skin:   no rash or abnormalities  Lungs:   clear to auscultation bilaterally  Heart:   regular rate and rhythm, S1, S2 normal, no murmur, click, rub or gallop  Breasts:   normal without suspicious masses, skin or nipple changes or axillary nodes  Abdomen:  normal findings: no organomegaly, soft, non-tender and no hernia  Pelvis:  External genitalia: normal general appearance Urinary system: urethral meatus normal and bladder without fullness, nontender Vaginal: normal without tenderness, induration or masses Bimanual exam deferred.     95% of 15 min visit spent on counseling and coordination of care.   Data Reviewed Past medical hx, labs  Assessment     Brittle hair loss High risk sexual behaviors Contraception counseling     Plan    Orders Placed This Encounter  Procedures  . SureSwab, Vaginosis/Vaginitis Plus  . HIV antibody (with reflex)  . RPR  . Hepatitis B surface antigen  . Hepatitis C antibody  . CBC with Differential/Platelet  . TSH   No orders of the defined types were placed in this encounter.     Possible management options include: removal of Nexplanon if hair loss continues and no other cause is identified.  Follow up in 3 months.

## 2015-01-31 ENCOUNTER — Telehealth: Payer: Self-pay | Admitting: *Deleted

## 2015-01-31 LAB — HEPATITIS C ANTIBODY: HCV AB: NEGATIVE

## 2015-01-31 LAB — HIV ANTIBODY (ROUTINE TESTING W REFLEX): HIV 1&2 Ab, 4th Generation: NONREACTIVE

## 2015-01-31 LAB — HEPATITIS B SURFACE ANTIGEN: Hepatitis B Surface Ag: NEGATIVE

## 2015-01-31 NOTE — Telephone Encounter (Signed)
Patient states she wants her Nexplanon out as soon as possible. Patient states she is having hair loss and has tried many different changes and believes it is due to the Nexplanon. Patient has been scheduled for 02-14-15 @ 10:45.

## 2015-02-02 ENCOUNTER — Telehealth: Payer: Self-pay | Admitting: *Deleted

## 2015-02-02 DIAGNOSIS — N760 Acute vaginitis: Principal | ICD-10-CM

## 2015-02-02 DIAGNOSIS — B9689 Other specified bacterial agents as the cause of diseases classified elsewhere: Secondary | ICD-10-CM

## 2015-02-02 LAB — SURESWAB, VAGINOSIS/VAGINITIS PLUS
ATOPOBIUM VAGINAE: 7.1 Log (cells/mL)
C. PARAPSILOSIS, DNA: NOT DETECTED
C. TRACHOMATIS RNA, TMA: NOT DETECTED
C. albicans, DNA: NOT DETECTED
C. glabrata, DNA: NOT DETECTED
C. tropicalis, DNA: NOT DETECTED
Gardnerella vaginalis: >8 Log (cells/mL)
LACTOBACILLUS SPECIES: NOT DETECTED Log (cells/mL)
MEGASPHAERA SPECIES: 7.4 Log (cells/mL)
N. GONORRHOEAE RNA, TMA: NOT DETECTED
T. vaginalis RNA, QL TMA: NOT DETECTED

## 2015-02-02 MED ORDER — METRONIDAZOLE 500 MG PO TABS: 500.0000 mg | ORAL_TABLET | Freq: Two times a day (BID) | ORAL | Status: DC

## 2015-02-02 NOTE — Telephone Encounter (Addendum)
-----   Message from Roe Coombsachelle A Denney, CNM sent at 02/02/2015  9:20 AM EST ----- Please treat for BV, if I have not already either Flagyl or metrogel is acceptable.  Thank you.  Patient advised of test results and prescription sent to the pharmacy.

## 2015-02-14 ENCOUNTER — Encounter: Payer: Self-pay | Admitting: Obstetrics

## 2015-02-14 ENCOUNTER — Ambulatory Visit (INDEPENDENT_AMBULATORY_CARE_PROVIDER_SITE_OTHER): Payer: Medicaid Other | Admitting: Obstetrics

## 2015-02-14 VITALS — BP 105/67 | HR 80 | Temp 98.0°F | Ht 63.0 in | Wt 110.0 lb

## 2015-02-14 DIAGNOSIS — Z3046 Encounter for surveillance of implantable subdermal contraceptive: Secondary | ICD-10-CM

## 2015-02-14 DIAGNOSIS — Z3049 Encounter for surveillance of other contraceptives: Secondary | ICD-10-CM

## 2015-02-14 DIAGNOSIS — Z0000 Encounter for general adult medical examination without abnormal findings: Secondary | ICD-10-CM

## 2015-02-14 MED ORDER — CITRANATAL HARMONY 27-1-260 MG PO CAPS: 1.0000 | ORAL_CAPSULE | Freq: Every day | ORAL | Status: DC

## 2015-02-14 NOTE — Progress Notes (Signed)

## 2015-02-14 NOTE — Addendum Note (Signed)
Addended by: Coral CeoHARPER, Sianna Garofano A on: 02/14/2015 12:24 PM   Modules accepted: Orders

## 2015-02-28 ENCOUNTER — Encounter: Payer: Self-pay | Admitting: Obstetrics

## 2015-02-28 ENCOUNTER — Ambulatory Visit (INDEPENDENT_AMBULATORY_CARE_PROVIDER_SITE_OTHER): Payer: Medicaid Other | Admitting: Obstetrics

## 2015-02-28 VITALS — BP 102/64 | HR 75 | Temp 97.7°F | Ht 63.0 in | Wt 110.0 lb

## 2015-02-28 DIAGNOSIS — Z975 Presence of (intrauterine) contraceptive device: Secondary | ICD-10-CM | POA: Diagnosis not present

## 2015-02-28 DIAGNOSIS — Z3009 Encounter for other general counseling and advice on contraception: Secondary | ICD-10-CM

## 2015-02-28 NOTE — Progress Notes (Signed)
Subjective:    Beverly Myers is a 24 y.o. female who presents for F/U after Nexplanon removal. The patient has no complaints today. The patient is sexually active. Pertinent past medical history: none.  The information documented in the HPI was reviewed and verified.  Menstrual History: OB History    Gravida Para Term Preterm AB TAB SAB Ectopic Multiple Living   1 1 1  0 0 0 0 0 0 1       Patient's last menstrual period was 02/18/2015.   Patient Active Problem List   Diagnosis Date Noted  . Brittle hair 01/30/2015   Past Medical History  Diagnosis Date  . Cellulitis 2012    in right foot  . UTI (urinary tract infection)     Past Surgical History  Procedure Laterality Date  . Tonsilectomy, adenoidectomy, bilateral myringotomy and tubes       Current outpatient prescriptions:  .  Prenat-FeFmCb-DSS-FA-DHA w/o A (CITRANATAL HARMONY) 27-1-260 MG CAPS, Take 1 capsule by mouth daily before breakfast., Disp: 30 capsule, Rfl: 11 No Known Allergies  History  Substance Use Topics  . Smoking status: Current Some Day Smoker  . Smokeless tobacco: Never Used     Comment: 3-5 cigarretts per day  . Alcohol Use: 4.2 oz/week    7 Glasses of wine per week    Family History  Problem Relation Age of Onset  . Cancer Paternal Grandmother        Review of Systems Constitutional: negative for weight loss Genitourinary:negative for abnormal menstrual periods and vaginal discharge   Objective:   BP 102/64 mmHg  Pulse 75  Temp(Src) 97.7 F (36.5 C)  Ht 5\' 3"  (1.6 m)  Wt 110 lb (49.896 kg)  BMI 19.49 kg/m2  LMP 02/18/2015   PE:        Left arm:  Nexplanon removal site healed, nontender.  Lab Review Urine pregnancy test Labs reviewed yes Radiologic studies reviewed no    Assessment:    24 y.o., discontinuing Nexplanon, no contraindications.   Plan:    All questions answered. Discussed healthy lifestyle modifications.  No orders of the defined types were placed in  this encounter.   No orders of the defined types were placed in this encounter.

## 2015-07-03 ENCOUNTER — Ambulatory Visit: Payer: Medicaid Other | Admitting: Obstetrics

## 2015-07-05 ENCOUNTER — Ambulatory Visit: Payer: Medicaid Other | Admitting: Obstetrics

## 2015-10-25 HISTORY — PX: DILATION AND CURETTAGE OF UTERUS: SHX78

## 2015-11-15 ENCOUNTER — Ambulatory Visit: Payer: Medicaid Other | Admitting: Obstetrics

## 2016-01-17 ENCOUNTER — Ambulatory Visit: Payer: Medicaid Other | Admitting: Certified Nurse Midwife

## 2016-01-18 ENCOUNTER — Ambulatory Visit: Payer: Medicaid Other | Admitting: Certified Nurse Midwife

## 2016-01-23 ENCOUNTER — Encounter: Payer: Self-pay | Admitting: Certified Nurse Midwife

## 2016-01-23 ENCOUNTER — Other Ambulatory Visit: Payer: Self-pay | Admitting: Certified Nurse Midwife

## 2016-01-23 ENCOUNTER — Ambulatory Visit (INDEPENDENT_AMBULATORY_CARE_PROVIDER_SITE_OTHER): Payer: Medicaid Other | Admitting: Certified Nurse Midwife

## 2016-01-23 VITALS — BP 118/77 | HR 104 | Temp 98.3°F | Ht 63.0 in | Wt 128.0 lb

## 2016-01-23 DIAGNOSIS — L739 Follicular disorder, unspecified: Secondary | ICD-10-CM

## 2016-01-23 DIAGNOSIS — Z01419 Encounter for gynecological examination (general) (routine) without abnormal findings: Secondary | ICD-10-CM | POA: Diagnosis not present

## 2016-01-23 DIAGNOSIS — N760 Acute vaginitis: Secondary | ICD-10-CM

## 2016-01-23 DIAGNOSIS — Z0000 Encounter for general adult medical examination without abnormal findings: Secondary | ICD-10-CM

## 2016-01-23 DIAGNOSIS — Z8759 Personal history of other complications of pregnancy, childbirth and the puerperium: Secondary | ICD-10-CM

## 2016-01-23 DIAGNOSIS — Z113 Encounter for screening for infections with a predominantly sexual mode of transmission: Secondary | ICD-10-CM

## 2016-01-23 LAB — HEPATITIS B SURFACE ANTIGEN: HEP B S AG: NEGATIVE

## 2016-01-23 LAB — CBC WITH DIFFERENTIAL/PLATELET
Basophils Absolute: 0 10*3/uL (ref 0.0–0.1)
Basophils Relative: 0 % (ref 0–1)
EOS ABS: 0 10*3/uL (ref 0.0–0.7)
EOS PCT: 1 % (ref 0–5)
HCT: 29.3 % — ABNORMAL LOW (ref 36.0–46.0)
Hemoglobin: 8.5 g/dL — ABNORMAL LOW (ref 12.0–15.0)
LYMPHS ABS: 1.1 10*3/uL (ref 0.7–4.0)
Lymphocytes Relative: 31 % (ref 12–46)
MCH: 19.4 pg — AB (ref 26.0–34.0)
MCHC: 29 g/dL — AB (ref 30.0–36.0)
MCV: 66.7 fL — AB (ref 78.0–100.0)
MONO ABS: 0.8 10*3/uL (ref 0.1–1.0)
MONOS PCT: 24 % — AB (ref 3–12)
Neutro Abs: 1.5 10*3/uL — ABNORMAL LOW (ref 1.7–7.7)
Neutrophils Relative %: 44 % (ref 43–77)
PLATELETS: 172 10*3/uL (ref 150–400)
RBC: 4.39 MIL/uL (ref 3.87–5.11)
RDW: 20.4 % — AB (ref 11.5–15.5)
WBC: 3.4 10*3/uL — ABNORMAL LOW (ref 4.0–10.5)

## 2016-01-23 LAB — COMPREHENSIVE METABOLIC PANEL
ALBUMIN: 4.7 g/dL (ref 3.6–5.1)
ALT: 10 U/L (ref 6–29)
AST: 17 U/L (ref 10–30)
Alkaline Phosphatase: 42 U/L (ref 33–115)
BILIRUBIN TOTAL: 0.4 mg/dL (ref 0.2–1.2)
BUN: 7 mg/dL (ref 7–25)
CALCIUM: 9.2 mg/dL (ref 8.6–10.2)
CHLORIDE: 103 mmol/L (ref 98–110)
CO2: 21 mmol/L (ref 20–31)
Creat: 0.76 mg/dL (ref 0.50–1.10)
Glucose, Bld: 102 mg/dL — ABNORMAL HIGH (ref 65–99)
Potassium: 3.5 mmol/L (ref 3.5–5.3)
Sodium: 136 mmol/L (ref 135–146)
Total Protein: 8 g/dL (ref 6.1–8.1)

## 2016-01-23 LAB — TSH: TSH: 0.59 mIU/L

## 2016-01-23 LAB — HEPATITIS C ANTIBODY: HCV Ab: NEGATIVE

## 2016-01-23 LAB — HIV ANTIBODY (ROUTINE TESTING W REFLEX): HIV: NONREACTIVE

## 2016-01-23 MED ORDER — TRIPLE ANTIBIOTIC 5-400-5000 EX OINT: TOPICAL_OINTMENT | Freq: Four times a day (QID) | CUTANEOUS | Status: DC

## 2016-01-23 MED ORDER — FLUCONAZOLE 100 MG PO TABS: 100.0000 mg | ORAL_TABLET | Freq: Once | ORAL | Status: DC

## 2016-01-23 MED ORDER — HYDROCORTISONE 1 % EX CREA: TOPICAL_CREAM | CUTANEOUS | Status: DC

## 2016-01-23 MED ORDER — TERCONAZOLE 0.4 % VA CREA: 1.0000 | TOPICAL_CREAM | Freq: Every day | VAGINAL | Status: DC

## 2016-01-23 NOTE — Progress Notes (Signed)
Patient ID: Beverly Myers, female   DOB: [birth year], 25 y.o.   MRN: [id number]    Subjective:        Beverly Myers is a 25 y.o. female here for a routine exam.  Current complaints: doing well.  Currently not working.  Interested in paraguard IUD.  Had unprotected sexual intercourse Monday, did plan B.    Had abortion with emergency D&C in December.  Desires full STD screening.  Encouraged patient to not shave or wax pubic area.    Personal health questionnaire:  Is patient [name], have a family history of breast and/or ovarian cancer: Yes, PGM BCA Is there a family history of uterine cancer diagnosed at age < 73, gastrointestinal cancer, urinary tract cancer, family member who is a Personnel officer syndrome-associated carrier: no Is the patient overweight and hypertensive, family history of diabetes, personal history of gestational diabetes, preeclampsia or PCOS: no Is patient over 80, have PCOS,  family history of premature CHD under age 99, diabetes, smoke, have hypertension or peripheral artery disease:  no At any time, has a partner hit, kicked or otherwise hurt or frightened you?: no Over the past 2 weeks, have you felt down, depressed or hopeless?: no Over the past 2 weeks, have you felt little interest or pleasure in doing things?:no   Gynecologic History Patient's last menstrual period was 01/05/2016. Contraception: none Last Pap: 10/26/13. Results were: normal Last mammogram: N/A.   Obstetric History OB History  Gravida Para Term Preterm AB SAB TAB Ectopic Multiple Living  0 0 0 0 0 0 1    # Outcome Date GA Lbr Len/2nd Weight Sex Delivery Anes PTL Lv  1 Term 06/09/12 [redacted]w[redacted]d 03:48 / 02:25 7 lb 14.1 oz (3.575 kg) M Vag-Spont EPI,Local  Y      Past Medical History  Diagnosis Date  . Cellulitis 2012    in right foot  . UTI (urinary tract infection)     Past Surgical History  Procedure Laterality Date  . Tonsilectomy, adenoidectomy, bilateral myringotomy and tubes     . Dilation and curettage of uterus  December 2016     Current outpatient prescriptions:  Marland Kitchen  Multiple Vitamins-Minerals (HAIR/SKIN/NAILS PO), Take by mouth., Disp: , Rfl:  .  vitamin C (ASCORBIC ACID) 500 MG tablet, Take 500 mg by mouth daily., Disp: , Rfl:  .  fluconazole (DIFLUCAN) 100 MG tablet, Take 1 tablet (100 mg total) by mouth once. Repeat dose in 48-72 hour., Disp: 3 tablet, Rfl: 0 .  hydrocortisone cream 1 %, Apply to affected area 2 times daily, Disp: 30 g, Rfl: 1 .  neomycin-bacitracin-polymyxin (NEOSPORIN) [phone] ointment, Apply topically 4 (four) times daily., Disp: 28.3 g, Rfl: 0 .  terconazole (TERAZOL 7) 0.4 % vaginal cream, Place 1 applicator vaginally at bedtime., Disp: 45 g, Rfl: 0 No Known Allergies  Social History  Substance Use Topics  . Smoking status: Former Games developer  . Smokeless tobacco: Former Neurosurgeon    Quit date: 08/25/2015     Comment: 3-5 cigarretts per day  . Alcohol Use: Yes     Comment: Ocassionally    Family History  Problem Relation Age of Onset  . Cancer Paternal Grandmother       Review of Systems  Constitutional: negative for fatigue and weight loss Respiratory: negative for cough and wheezing Cardiovascular: negative for chest pain, fatigue and palpitations Gastrointestinal: negative for abdominal pain and change in bowel habits Musculoskeletal:negative for myalgias Neurological: negative for gait problems and  tremors Behavioral/Psych: negative for abusive relationship, depression Endocrine: negative for temperature intolerance   Genitourinary:negative for abnormal menstrual periods, genital lesions, hot flashes, sexual problems and vaginal discharge Integument/breast: negative for breast lump, breast tenderness, nipple discharge and skin lesion(s)    Objective:       BP 118/77 mmHg  Pulse 104  Temp(Src) 98.3 F (36.8 C)  Ht  (1.6 m)  Wt 128 lb (58.06 kg)  BMI 22.68 kg/m2  LMP 01/05/2016 General:   alert  Skin:   no  rash or abnormalities  Lungs:   clear to auscultation bilaterally  Heart:   regular rate and rhythm, S1, S2 normal, no murmur, click, rub or gallop  Breasts:   normal without suspicious masses, skin or nipple changes or axillary nodes  Abdomen:  normal findings: no organomegaly, soft, non-tender and no hernia  Pelvis:  External genitalia: normal general appearance, + erythremic raised liesions Urinary system: urethral meatus normal and bladder without fullness, nontender Vaginal: normal without tenderness, induration or masses, +white thick vaginal discharge Cervix: normal appearance Adnexa: normal bimanual exam Uterus: anteverted and non-tender, normal size   Lab Review Urine pregnancy test Labs reviewed yes Radiologic studies reviewed no  50% of 30 min visit spent on counseling and coordination of care.   Assessment:    Healthy female exam.   STD screening exam  high risk sexual behaviors  vaginitis  folliculitis  Plan:    Education reviewed: calcium supplements, depression evaluation, low fat, low cholesterol diet, safe sex/STD prevention, self breast exams, skin cancer screening and weight bearing exercise. Follow up in: 1 month.   Meds ordered this encounter  Medications  . Multiple Vitamins-Minerals (HAIR/SKIN/NAILS PO)    Sig: Take by mouth.  . vitamin C (ASCORBIC ACID) 500 MG tablet    Sig: Take 500 mg by mouth daily.  . fluconazole (DIFLUCAN) 100 MG tablet    Sig: Take 1 tablet (100 mg total) by mouth once. Repeat dose in 48-72 hour.    Dispense:  3 tablet    Refill:  0  . terconazole (TERAZOL 7) 0.4 % vaginal cream    Sig: Place 1 applicator vaginally at bedtime.    Dispense:  45 g    Refill:  0  . hydrocortisone cream 1 %    Sig: Apply to affected area 2 times daily    Dispense:  30 g    Refill:  1  . neomycin-bacitracin-polymyxin (NEOSPORIN) [phone] ointment    Sig: Apply topically 4 (four) times daily.    Dispense:  28.3 g    Refill:  0   Orders  Placed This Encounter  Procedures  . HIV antibody (with reflex)  . Hepatitis B surface antigen  . RPR  . Hepatitis C antibody  . CBC with Differential/Platelet  . Comprehensive metabolic panel  . TSH   Need to obtain previous records Possible management options include: another LARK Follow up at start of next period for Paraguard.

## 2016-01-24 ENCOUNTER — Other Ambulatory Visit: Payer: Self-pay | Admitting: Certified Nurse Midwife

## 2016-01-24 DIAGNOSIS — D649 Anemia, unspecified: Secondary | ICD-10-CM

## 2016-01-24 LAB — RPR

## 2016-01-24 MED ORDER — FUSION PLUS PO CAPS: 1.0000 | ORAL_CAPSULE | Freq: Every day | ORAL | Status: DC

## 2016-01-25 ENCOUNTER — Other Ambulatory Visit: Payer: Self-pay | Admitting: Certified Nurse Midwife

## 2016-01-25 LAB — PAP IG W/ RFLX HPV ASCU

## 2016-01-26 LAB — CP4064 IRON PANEL 1
%SAT: 3 % — ABNORMAL LOW (ref 11–50)
Ferritin: 10 ng/mL (ref 10–154)
IRON: 15 ug/dL — AB (ref 40–190)
TIBC: 511 ug/dL — AB (ref 250–450)
Transferrin: 461 mg/dL — ABNORMAL HIGH (ref 188–341)
UIBC: 496 ug/dL — AB (ref 125–400)

## 2016-01-28 LAB — SURESWAB, VAGINOSIS/VAGINITIS PLUS
ATOPOBIUM VAGINAE: NOT DETECTED Log (cells/mL)
C. ALBICANS, DNA: NOT DETECTED
C. PARAPSILOSIS, DNA: NOT DETECTED
C. TROPICALIS, DNA: NOT DETECTED
C. glabrata, DNA: NOT DETECTED
C. trachomatis RNA, TMA: NOT DETECTED
Gardnerella vaginalis: NOT DETECTED Log (cells/mL)
LACTOBACILLUS SPECIES: 7.4 Log (cells/mL)
MEGASPHAERA SPECIES: NOT DETECTED Log (cells/mL)
N. GONORRHOEAE RNA, TMA: NOT DETECTED
T. VAGINALIS RNA, QL TMA: NOT DETECTED

## 2016-01-30 ENCOUNTER — Other Ambulatory Visit: Payer: Self-pay | Admitting: Certified Nurse Midwife

## 2016-02-01 ENCOUNTER — Telehealth: Payer: Self-pay | Admitting: *Deleted

## 2016-02-01 NOTE — Telephone Encounter (Signed)
Pt called to office regarding her Iron Rx not being covered by insurance. Return call to pt.  Pt made aware that I am working on approval for current Rx and if not able to be covered we may send a different Rx. Pt made aware that I will call her when decision made on Rx coverage.

## 2016-02-13 ENCOUNTER — Other Ambulatory Visit: Payer: Self-pay | Admitting: *Deleted

## 2016-02-13 DIAGNOSIS — D649 Anemia, unspecified: Secondary | ICD-10-CM

## 2016-02-13 MED ORDER — FUSION PLUS PO CAPS: 1.0000 | ORAL_CAPSULE | Freq: Every day | ORAL | Status: DC

## 2016-02-13 MED ORDER — VITAFOL FE+ 90-1-200 & 50 MG PO CPPK: 1.0000 | ORAL_CAPSULE | Freq: Every day | ORAL | Status: DC

## 2016-02-13 NOTE — Progress Notes (Signed)
Vitafol FE was sent to pharmacy per R. Denney, CNM due to coverage denied for Fusion Plus. Pt aware.

## 2016-02-22 ENCOUNTER — Telehealth: Payer: Self-pay | Admitting: *Deleted

## 2016-02-22 NOTE — Telephone Encounter (Signed)
Patient is still interested in the ParaGard IUD. Patient advised to contact the office on the first day of her next menstrual cycle and not to have any unprotected intercourse. Patient verbalized understanding and will call back to schedule.

## 2016-03-13 ENCOUNTER — Ambulatory Visit (INDEPENDENT_AMBULATORY_CARE_PROVIDER_SITE_OTHER): Payer: Medicaid Other | Admitting: Certified Nurse Midwife

## 2016-03-13 VITALS — BP 103/66 | HR 64 | Wt 120.0 lb

## 2016-03-13 DIAGNOSIS — Z862 Personal history of diseases of the blood and blood-forming organs and certain disorders involving the immune mechanism: Secondary | ICD-10-CM

## 2016-03-13 DIAGNOSIS — Z30430 Encounter for insertion of intrauterine contraceptive device: Secondary | ICD-10-CM

## 2016-03-13 DIAGNOSIS — Z30014 Encounter for initial prescription of intrauterine contraceptive device: Secondary | ICD-10-CM | POA: Diagnosis not present

## 2016-03-13 NOTE — Progress Notes (Signed)
Patient ID: Beverly Myers, female   DOB: 11-20-1991, 25 y.o.   MRN: [id number]  IUD Procedure Note   DIAGNOSIS: Desires long-term, reversible contraception   PROCEDURE: IUD placement Performing Provider: Orvilla Cornwallachelle Jaquon Gingerich CNM  Patient counseled prior to procedure. I explained risks and benefits of Paraguard IUD, reviewed alternative forms of contraception. Patient stated understanding and consented to continue with procedure.   LMP: 03/08/16 Pregnancy Test: Negative Lot #: [id number] Expiration Date: April 2023   IUD type: [    ] Mirena   [X]  Paraguard  [   ] Christean GriefSkyla   [   ]  Kyleena  PROCEDURE:  Timeout procedure was performed to ensure right patient and right site.  A bimanual exam was performed to determine the position of the uterus, retroverted. The speculum was placed. The vagina and cervix was sterilized in the usual manner and sterile technique was maintained throughout the course of the procedure. A single toothed tenaculum was not required. The depth of the uterus was sounded to 8.5 cm. The IUD was inserted to the appropriate depth and inserted without difficulty.  The string was cut to an estimated 4 cm length. Bleeding was minimal. The patient tolerated the procedure well.   Follow up: The patient tolerated the procedure well without complications.  Standard post-procedure care is explained and return precautions are given.  Orvilla Cornwallachelle Mary-Anne Polizzi CNM

## 2016-03-14 LAB — CBC
HEMATOCRIT: 35.4 % (ref 34.0–46.6)
HEMOGLOBIN: 10.6 g/dL — AB (ref 11.1–15.9)
MCH: 22.4 pg — ABNORMAL LOW (ref 26.6–33.0)
MCHC: 29.9 g/dL — ABNORMAL LOW (ref 31.5–35.7)
MCV: 75 fL — ABNORMAL LOW (ref 79–97)
Platelets: 406 10*3/uL — ABNORMAL HIGH (ref 150–379)
RBC: 4.74 x10E6/uL (ref 3.77–5.28)
RDW: 24.9 % — ABNORMAL HIGH (ref 12.3–15.4)
WBC: 7 10*3/uL (ref 3.4–10.8)

## 2016-04-10 ENCOUNTER — Ambulatory Visit: Payer: Medicaid Other | Admitting: Certified Nurse Midwife

## 2016-05-16 ENCOUNTER — Ambulatory Visit (INDEPENDENT_AMBULATORY_CARE_PROVIDER_SITE_OTHER): Payer: Medicaid Other | Admitting: Certified Nurse Midwife

## 2016-05-16 ENCOUNTER — Encounter: Payer: Self-pay | Admitting: Certified Nurse Midwife

## 2016-05-16 VITALS — BP 106/72 | HR 76 | Wt 113.0 lb

## 2016-05-16 DIAGNOSIS — N898 Other specified noninflammatory disorders of vagina: Secondary | ICD-10-CM | POA: Diagnosis not present

## 2016-05-16 DIAGNOSIS — Z30431 Encounter for routine checking of intrauterine contraceptive device: Secondary | ICD-10-CM

## 2016-05-16 DIAGNOSIS — N939 Abnormal uterine and vaginal bleeding, unspecified: Secondary | ICD-10-CM

## 2016-05-16 DIAGNOSIS — D509 Iron deficiency anemia, unspecified: Secondary | ICD-10-CM | POA: Insufficient documentation

## 2016-05-16 MED ORDER — IRON POLYSACCH CMPLX-B12-FA 150-0.025-1 MG PO CAPS: 1.0000 | ORAL_CAPSULE | Freq: Every day | ORAL | Status: DC

## 2016-05-16 NOTE — Progress Notes (Signed)
Patient ID: Beverly Myers, female   DOB: 1991-06-15, 25 y.o.   MRN: [id number]   Chief Complaint  Patient presents with  . Follow-up    IUD check Spotting    HPI Beverly Myers is a 25 y.o. female.  Here for f/u on IUD.  Reports being happy with her IUD.  Has had 2 weeks of brown spotting instead of her normal period.  Desires STD screening.  Also, desires to have IUD strings trimmed.     HPI  Past Medical History  Diagnosis Date  . Cellulitis 2012    in right foot  . UTI (urinary tract infection)     Past Surgical History  Procedure Laterality Date  . Tonsilectomy, adenoidectomy, bilateral myringotomy and tubes    . Dilation and curettage of uterus  December 2016    Family History  Problem Relation Age of Onset  . Cancer Paternal Grandmother     Social History Social History  Substance Use Topics  . Smoking status: Former Games developermoker  . Smokeless tobacco: Former NeurosurgeonUser    Quit date: 08/25/2015     Comment: 3-5 cigarretts per day  . Alcohol Use: Yes     Comment: Ocassionally    No Known Allergies  Current Outpatient Prescriptions  Medication Sig Dispense Refill  . Prenat-FePoly-Metf-FA-DHA-DSS (VITAFOL FE+) 90-1-200 & 50 MG CPPK Take 1 capsule by mouth daily. 30 each 11  . Iron Polysacch Cmplx-B12-FA 150-0.025-1 MG CAPS Take 1 tablet by mouth daily. 30 each 4   No current facility-administered medications for this visit.    Review of Systems Review of Systems Constitutional: negative for fatigue and weight loss Respiratory: negative for cough and wheezing Cardiovascular: negative for chest pain, fatigue and palpitations Gastrointestinal: negative for abdominal pain and change in bowel habits Genitourinary:negative Integument/breast: negative for nipple discharge Musculoskeletal:negative for myalgias Neurological: negative for gait problems and tremors Behavioral/Psych: negative for abusive relationship, depression Endocrine: negative for temperature  intolerance     Blood pressure 106/72, pulse 76, weight 113 lb (51.256 kg).  Physical Exam Physical Exam General:   alert  Skin:   no rash or abnormalities  Lungs:   clear to auscultation bilaterally  Heart:   regular rate and rhythm, S1, S2 normal, no murmur, click, rub or gallop  Breasts:   deferred  Abdomen:  normal findings: no organomegaly, soft, non-tender and no hernia  Pelvis:  External genitalia: normal general appearance Urinary system: urethral meatus normal and bladder without fullness, nontender Vaginal: normal without tenderness, induration or masses Cervix: normal appearance, + IUD strings: strings trimmed to 4 cm Adnexa: normal bimanual exam Uterus: anteverted and non-tender, normal size    50% of 15 min visit spent on counseling and coordination of care.   Data Reviewed Previous medical hx, meds  Assessment     IUD check up: strings in place H/O Anemia     Plan    Orders Placed This Encounter  Procedures  . CBC with Differential/Platelet  . Iron  . Iron Binding Cap (TIBC)  . Ferritin  . NuSwab Vaginitis Plus (VG+)   Meds ordered this encounter  Medications  . Iron Polysacch Cmplx-B12-FA 150-0.025-1 MG CAPS    Sig: Take 1 tablet by mouth daily.    Dispense:  30 each    Refill:  4    Niferex     Possible management options include: Hematology consult if H&H is decreased on Vitafol FE Follow up as needed.

## 2016-05-17 LAB — CBC WITH DIFFERENTIAL/PLATELET
BASOS: 0 %
Basophils Absolute: 0 10*3/uL (ref 0.0–0.2)
EOS (ABSOLUTE): 0.1 10*3/uL (ref 0.0–0.4)
EOS: 1 %
HEMATOCRIT: 36.9 % (ref 34.0–46.6)
HEMOGLOBIN: 12.1 g/dL (ref 11.1–15.9)
IMMATURE GRANS (ABS): 0 10*3/uL (ref 0.0–0.1)
Immature Granulocytes: 0 %
LYMPHS: 25 %
Lymphocytes Absolute: 2.2 10*3/uL (ref 0.7–3.1)
MCH: 25.7 pg — AB (ref 26.6–33.0)
MCHC: 32.8 g/dL (ref 31.5–35.7)
MCV: 78 fL — AB (ref 79–97)
MONOCYTES: 9 %
Monocytes Absolute: 0.8 10*3/uL (ref 0.1–0.9)
NEUTROS ABS: 5.6 10*3/uL (ref 1.4–7.0)
Neutrophils: 65 %
Platelets: 325 10*3/uL (ref 150–379)
RBC: 4.71 x10E6/uL (ref 3.77–5.28)
RDW: 17.4 % — ABNORMAL HIGH (ref 12.3–15.4)
WBC: 8.7 10*3/uL (ref 3.4–10.8)

## 2016-05-17 LAB — IRON AND TIBC
IRON SATURATION: 18 % (ref 15–55)
Iron: 86 ug/dL (ref 27–159)
TIBC: 483 ug/dL — AB (ref 250–450)
UIBC: 397 ug/dL (ref 131–425)

## 2016-05-17 LAB — FERRITIN: FERRITIN: 13 ng/mL — AB (ref 15–150)

## 2016-05-20 LAB — NUSWAB VAGINITIS PLUS (VG+)
Candida albicans, NAA: NEGATIVE
Candida glabrata, NAA: NEGATIVE
Chlamydia trachomatis, NAA: NEGATIVE
Neisseria gonorrhoeae, NAA: NEGATIVE
Trich vag by NAA: NEGATIVE

## 2016-08-06 ENCOUNTER — Telehealth: Payer: Self-pay | Admitting: *Deleted

## 2016-08-06 NOTE — Telephone Encounter (Signed)
Talked to patient and problem resolved.

## 2016-08-06 NOTE — Telephone Encounter (Signed)
Patient called for UTI symptoms of urgency and frequency.Macrobid 100 mg po bid called to [facility] [place] [facility] [phone].

## 2016-12-15 ENCOUNTER — Ambulatory Visit: Payer: Medicaid Other | Admitting: Obstetrics

## 2016-12-26 ENCOUNTER — Other Ambulatory Visit (HOSPITAL_COMMUNITY)
Admission: RE | Admit: 2016-12-26 | Discharge: 2016-12-26 | Disposition: A | Discharge status: Home / Self Care | Payer: Medicaid Other | Source: Ambulatory Visit | Attending: Obstetrics | Admitting: Obstetrics

## 2016-12-26 ENCOUNTER — Encounter: Payer: Self-pay | Admitting: Obstetrics

## 2016-12-26 ENCOUNTER — Ambulatory Visit (INDEPENDENT_AMBULATORY_CARE_PROVIDER_SITE_OTHER): Payer: Medicaid Other | Admitting: Obstetrics

## 2016-12-26 VITALS — BP 121/76 | HR 80 | Wt 112.6 lb

## 2016-12-26 DIAGNOSIS — Z202 Contact with and (suspected) exposure to infections with a predominantly sexual mode of transmission: Secondary | ICD-10-CM

## 2016-12-26 DIAGNOSIS — Z711 Person with feared health complaint in whom no diagnosis is made: Secondary | ICD-10-CM

## 2016-12-26 DIAGNOSIS — Z113 Encounter for screening for infections with a predominantly sexual mode of transmission: Secondary | ICD-10-CM | POA: Insufficient documentation
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
## 2016-12-27 LAB — HEPATITIS B SURFACE ANTIGEN: HEP B S AG: NEGATIVE

## 2016-12-27 LAB — RPR: RPR: NONREACTIVE

## 2016-12-27 LAB — HEPATITIS C ANTIBODY

## 2016-12-27 LAB — HIV ANTIBODY (ROUTINE TESTING W REFLEX): HIV SCREEN 4TH GENERATION: NONREACTIVE

## 2016-12-30 LAB — CERVICOVAGINAL ANCILLARY ONLY
Bacterial vaginitis: NEGATIVE
CHLAMYDIA, DNA PROBE: NEGATIVE
Candida vaginitis: NEGATIVE
Neisseria Gonorrhea: NEGATIVE
TRICH (WINDOWPATH): NEGATIVE

## 2017-03-26 ENCOUNTER — Other Ambulatory Visit: Payer: Self-pay | Admitting: Obstetrics

## 2017-03-26 ENCOUNTER — Telehealth: Payer: Self-pay

## 2017-03-26 DIAGNOSIS — Z0000 Encounter for general adult medical examination without abnormal findings: Secondary | ICD-10-CM

## 2017-03-26 MED ORDER — VITAFOL FE+ 90-1-200 & 50 MG PO CPPK: 1.0000 | ORAL_CAPSULE | Freq: Every day | ORAL | 11 refills | Status: DC
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
## 2019-06-09 ENCOUNTER — Ambulatory Visit: Payer: Self-pay

## 2019-06-20 ENCOUNTER — Ambulatory Visit: Payer: Self-pay | Admitting: Women's Health

## 2019-10-14 ENCOUNTER — Other Ambulatory Visit (HOSPITAL_COMMUNITY)
Admission: RE | Admit: 2019-10-14 | Discharge: 2019-10-14 | Disposition: A | Discharge status: Home / Self Care | Payer: Medicaid Other | Source: Ambulatory Visit | Attending: Medical | Admitting: Medical

## 2019-10-14 ENCOUNTER — Ambulatory Visit: Payer: Medicaid Other | Admitting: Women's Health

## 2019-10-14 ENCOUNTER — Other Ambulatory Visit: Payer: Self-pay

## 2019-10-14 ENCOUNTER — Encounter: Payer: Self-pay | Admitting: Women's Health

## 2019-10-14 VITALS — BP 109/74 | HR 79 | Ht 63.0 in | Wt 133.0 lb

## 2019-10-14 DIAGNOSIS — Z113 Encounter for screening for infections with a predominantly sexual mode of transmission: Secondary | ICD-10-CM

## 2019-10-14 DIAGNOSIS — Z01419 Encounter for gynecological examination (general) (routine) without abnormal findings: Secondary | ICD-10-CM | POA: Diagnosis not present

## 2019-10-14 DIAGNOSIS — Z0000 Encounter for general adult medical examination without abnormal findings: Secondary | ICD-10-CM | POA: Diagnosis not present

## 2019-10-14 NOTE — Progress Notes (Signed)
GYNECOLOGY ANNUAL PREVENTATIVE CARE ENCOUNTER NOTE  History:     Beverly Myers is a 28 y.o. G45P1001 female here for a routine annual gynecologic exam.  Current complaints: none. Denies abnormal vaginal bleeding, discharge, pelvic pain, problems with intercourse or other gynecologic concerns. Pt requests STD testing today. Pt reports she does perform SBE. Pt denies bowel or bladder concerns. No family hx of colon, endometrial or ovarian cancer. PGm - breast cancer, double mastectomy age >88. Pt does not smoke, drink or use drugs. Last dental exam: last year. Last eye exam: last year, scheduled in next month.      Gynecologic History Patient's last menstrual period was 10/06/2019. Contraception: IUD. Pt reports she does not desire pregnancy in the next year. Last Pap: 2years. Results were: abnormal per patient report. Pt did no receive f/u.  Obstetric History OB History  Gravida Para Term Preterm AB Living  1 1 1  0 0 1  SAB TAB Ectopic Multiple Live Births  0 0 0 0 1    # Outcome Date GA Lbr Len/2nd Weight Sex Delivery Anes PTL Lv  1 Term 06/09/12 [redacted]w[redacted]d 03:48 / 02:25 7 lb 14.1 oz (3.575 kg) M Vag-Spont EPI, Local  LIV    Past Medical History:  Diagnosis Date  . Cellulitis 2012   in right foot  . UTI (urinary tract infection)     Past Surgical History:  Procedure Laterality Date  . DILATION AND CURETTAGE OF UTERUS  December 2016  . TONSILECTOMY, ADENOIDECTOMY, BILATERAL MYRINGOTOMY AND TUBES      Current Outpatient Medications on File Prior to Visit  Medication Sig Dispense Refill  . Prenat-FePoly-Metf-FA-DHA-DSS (VITAFOL FE+) 90-1-200 & 50 MG CPPK Take 1 capsule by mouth daily. 30 each 11  . Iron Polysacch Cmplx-B12-FA 150-0.025-1 MG CAPS Take 1 tablet by mouth daily. (Patient not taking: Reported on 12/26/2016) 30 each 4   No current facility-administered medications on file prior to visit.     No Known Allergies  Social History:  reports that she has quit  smoking. She quit smokeless tobacco use about 4 years ago. She reports current alcohol use. She reports that she does not use drugs.  Family History  Problem Relation Age of Onset  . Cancer Paternal Grandmother     The following portions of the patient's history were reviewed and updated as appropriate: allergies, current medications, past family history, past medical history, past social history, past surgical history and problem list.  Review of Systems Pertinent items noted in HPI and remainder of comprehensive ROS otherwise negative.  Physical Exam:  BP 109/74   Pulse 79   Ht 5\' 3"  (1.6 m)   Wt 133 lb (60.3 kg)   LMP 10/06/2019   BMI 23.56 kg/m  CONSTITUTIONAL: Well-developed, well-nourished female in no acute distress.  HENT:  Normocephalic, atraumatic, External right and left ear normal. EYES: Conjunctivae and EOM are normal. Pupils are equal, round, and reactive to light. No scleral icterus.  NECK: Normal range of motion, supple, no masses.  Normal thyroid.  SKIN: Skin is warm and dry. No rash noted. Not diaphoretic. No erythema. No pallor. MUSCULOSKELETAL: Normal range of motion. No tenderness.  No cyanosis, clubbing, or edema. NEUROLOGIC: Alert and oriented to person, place, and time. Normal reflexes, muscle tone coordination. PSYCHIATRIC: Normal mood and affect. Normal behavior. Normal judgment and thought content. CARDIOVASCULAR: Normal heart rate noted, regular rhythm. RESPIRATORY: Clear to auscultation bilaterally. Effort and breath sounds normal, no problems with respiration noted. BREASTS: Symmetric  in size. No masses, skin changes, nipple drainage, or lymphadenopathy. ABDOMEN: Soft, normal bowel sounds, no distention noted.  No tenderness, rebound or guarding.  PELVIC: Normal appearing external genitalia; normal appearing vaginal mucosa and cervix.  No abnormal discharge noted.  Pap smear obtained.  Normal uterine size, no other palpable masses, no uterine or adnexal  tenderness. Two white IUD strings visualized at os.   Assessment and Plan:       1. Well woman exam - Cytology - PAP( [place])  2. Routine screening for STI (sexually transmitted infection) - HIV antibody (with reflex) - Hepatitis B Surface AntiGEN - Hepatitis C Antibody - RPR - Cervicovaginal ancillary only( [place])  Will follow up results of pap smear and other testing, if performed, and manage accordingly. Routine preventative health maintenance measures emphasized. Self-breast awareness taught, importance discussed, advised when to RTC, SBA literature given. Please refer to After Visit Summary for other counseling recommendations.      Gearldine Shown, [facility] Women[facility] Nurse Practitioner, [facility] for [employer], [facility]

## 2019-10-14 NOTE — Patient Instructions (Signed)
Breast Self-Awareness Breast self-awareness means being familiar with how your breasts look and feel. It involves checking your breasts regularly and reporting any changes to your health care provider. Practicing breast self-awareness is important. Sometimes changes may not be harmful (are benign), but sometimes a change in your breasts can be a sign of a serious medical problem. It is important to learn how to do this procedure correctly so that you can catch problems early, when treatment is more likely to be successful. All women should practice breast self-awareness, including women who have had breast implants. What you need:  A mirror.  A well-lit room. How to do a breast self-exam A breast self-exam is one way to learn what is normal for your breasts and whether your breasts are changing. To do a breast self-exam: Look for changes  1. Remove all the clothing above your waist. 2. Stand in front of a mirror in a room with good lighting. 3. Put your hands on your hips. 4. Push your hands firmly downward. 5. Compare your breasts in the mirror. Look for differences between them (asymmetry), such as: ? Differences in shape. ? Differences in size. ? Puckers, dips, and bumps in one breast and not the other. 6. Look at each breast for changes in the skin, such as: ? Redness. ? Scaly areas. 7. Look for changes in your nipples, such as: ? Discharge. ? Bleeding. ? Dimpling. ? Redness. ? A change in position. Feel for changes Carefully feel your breasts for lumps and changes. It is best to do this while lying on your back on the floor, and again while sitting or standing in the tub or shower with soapy water on your skin. Feel each breast in the following way: 1. Place the arm on the side of the breast you are examining above your head. 2. Feel your breast with the other hand. 3. Start in the nipple area and make -inch (2 cm) overlapping circles to feel your breast. Use the pads of your  three middle fingers to do this. Apply light pressure, then medium pressure, then firm pressure. The light pressure will allow you to feel the tissue closest to the skin. The medium pressure will allow you to feel the tissue that is a little deeper. The firm pressure will allow you to feel the tissue close to the ribs. 4. Continue the overlapping circles, moving downward over the breast until you feel your ribs below your breast. 5. Move one finger-width toward the center of the body. Continue to use the -inch (2 cm) overlapping circles to feel your breast as you move slowly up toward your collarbone. 6. Continue the up-and-down exam using all three pressures until you reach your armpit.  Write down what you find Writing down what you find can help you remember what to discuss with your health care provider. Write down:  What is normal for each breast.  Any changes that you find in each breast, including: ? The kind of changes you find. ? Any pain or tenderness. ? Size and location of any lumps.  Where you are in your menstrual cycle, if you are still menstruating. General tips and recommendations  Examine your breasts every month.  If you are breastfeeding, the best time to examine your breasts is after a feeding or after using a breast pump.  If you menstruate, the best time to examine your breasts is 5-7 days after your period. Breasts are generally lumpier during menstrual periods, and it may  be more difficult to notice changes.  With time and practice, you will become more familiar with the variations in your breasts and more comfortable with the exam. Contact a health care provider if you:  See a change in the shape or size of your breasts or nipples.  See a change in the skin of your breast or nipples, such as a reddened or scaly area.  Have unusual discharge from your nipples.  Find a lump or thick area that was not there before.  Have pain in your breasts.  Have any  concerns related to your breast health. Summary  Breast self-awareness includes looking for physical changes in your breasts, as well as feeling for any changes within your breasts.  Breast self-awareness should be performed in front of a mirror in a well-lit room.  You should examine your breasts every month. If you menstruate, the best time to examine your breasts is 5-7 days after your menstrual period.  Let your health care provider know of any changes you notice in your breasts, including changes in size, changes on the skin, pain or tenderness, or unusual fluid from your nipples. This information is not intended to replace advice given to you by your health care provider. Make sure you discuss any questions you have with your health care provider. Document Released: 11/10/2005 Document Revised: 06/29/2018 Document Reviewed: 06/29/2018 Elsevier Patient Education  2020 [place] Sexually Transmitted Infections, Adult Sexually transmitted infections (STIs) are diseases that are passed (transmitted) from person to person through bodily fluids exchanged during sex or sexual contact. Bodily fluids include saliva, semen, blood, vaginal mucus, and urine. You may have an increased risk for developing an STI if you have unprotected oral, vaginal, or anal sex. Some common STIs include:  Herpes.  Hepatitis B.  Chlamydia.  Gonorrhea.  Syphilis.  HPV (human papillomavirus).  HIV (human immunodeficiency virus), the virus that can cause AIDS (acquired immunodeficiency syndrome). How can I protect myself from sexually transmitted infections? The only way to completely prevent STIs is not to have sex of any kind (practice abstinence). This includes oral, vaginal, or anal sex. If you are sexually active, take these actions to lower your risk of getting an STI:  Have only one sex partner (be monogamous) or limit the number of sexual partners you have.  Stay up-to-date on  immunizations. Certain vaccines can lower your risk of getting certain STIs, such as: ? Hepatitis A and B vaccines. You may have been vaccinated as a young child, but likely need a booster shot as a teen or young adult. ? HPV vaccine.  Use methods that prevent the exchange of body fluids between partners (barrier protection) every time you have sex. Barrier protection can be used during oral, vaginal, or anal sex. Commonly used barrier methods include: ? Female condom. ? Female condom. ? Dental dam.  Get tested regularly for STIs. Have your sexual partner get tested regularly as well.  Avoid mixing alcohol, drugs, and sex. Alcohol and drug use can affect your ability to make good decisions and can lead to risky sexual behaviors.  Ask your health care provider about taking pre-exposure prophylaxis (PrEP) to prevent HIV infection if you: ? Have a HIV-positive sexual partner. ? Have multiple sexual partners or partners who do not know their HIV status, and do not regularly use a condom during sex. ? Use injection drugs and share needles. Birth control pills, injections, implants, and intrauterine devices (IUDs) do not protect against STIs. To prevent  both STIs and pregnancy, always use a condom with another form of birth control. Some STIs, such as herpes, are spread through skin to skin contact. A condom does not protect you from getting such STIs. If you or your partner have herpes and there is an active flare with open sores, avoid all sexual contact. Why are these changes important? Taking steps to practice safe sex protects you and others. Many STIs can be cured. However, some STIs are not curable and will affect you for the rest of your life. STIs can be passed on to another person even if you do not have symptoms. What can happen if changes are not made? Certain STIs may:  Require you to take medicine for the rest of your life.  Affect your ability to have children (your  fertility).  Increase your risk for developing another STI or certain serious health conditions, such as: ? Cervical cancer. ? Head and neck cancer. ? Pelvic inflammatory disease (PID) in women. ? Organ damage or damage to other parts of your body, if the infection spreads.  Be passed to a baby during childbirth. How are sexually transmitted infections treated? If you or your partner know or think that you may have an STI:  Talk with your health care provider about what can be done to treat it. Some STIs can be treated and cured with medicines.  For curable STIs, you and your partner should avoid sex during treatment and for several days after treatment is complete.  You and your partner should both be treated at the same time, if there is any chance that your partner is infected as well. If you get treatment but your partner does not, your partner can re-infect you when you resume sexual contact.  Do not have unprotected sex. Where to find more information Learn more about sexually transmitted diseases and infections from:  Centers for Disease Control and Prevention: ? More information about specific STIs: AppraiserFraud.fi ? Find places to get sexual health counseling and treatment for free or for a low cost: gettested.StoreMirror.com.cy  U.S. Department of Health and Human Services: http://white.info/.html Summary  The only way to completely prevent STIs is not to have sex (practice abstinence), including oral, vaginal, or anal sex.  STIs can spread through saliva, semen, blood, vaginal mucus, urine, or sexual contact.  If you do have sex, limit your number of sexual partners and use a barrier protection method every time you have sex.  If you develop an STI, get treated right away and ask your partner to be treated as well. Do not resume having sex until both of you have completed treatment for the STI. This  information is not intended to replace advice given to you by your health care provider. Make sure you discuss any questions you have with your health care provider. Document Released: 11/06/2016 Document Revised: 04/16/2018 Document Reviewed: 11/06/2016 Elsevier Patient Education  2020 [place] 90-77 Years Old, Female Preventive care refers to visits with your health care provider and lifestyle choices that can promote health and wellness. This includes:  A yearly physical exam. This may also be called an annual well check.  Regular dental visits and eye exams.  Immunizations.  Screening for certain conditions.  Healthy lifestyle choices, such as eating a healthy diet, getting regular exercise, not using drugs or products that contain nicotine and tobacco, and limiting alcohol use. What can I expect for my preventive care visit? Physical exam Your health care provider will  check your:  Height and weight. This may be used to calculate body mass index (BMI), which tells if you are at a healthy weight.  Heart rate and blood pressure.  Skin for abnormal spots. Counseling Your health care provider may ask you questions about your:  Alcohol, tobacco, and drug use.  Emotional well-being.  Home and relationship well-being.  Sexual activity.  Eating habits.  Work and work Statistician.  Method of birth control.  Menstrual cycle.  Pregnancy history. What immunizations do I need?  Influenza (flu) vaccine  This is recommended every year. Tetanus, diphtheria, and pertussis (Tdap) vaccine  You may need a Td booster every 10 years. Varicella (chickenpox) vaccine  You may need this if you have not been vaccinated. Human papillomavirus (HPV) vaccine  If recommended by your health care provider, you may need three doses over 6 months. Measles, mumps, and rubella (MMR) vaccine  You may need at least one dose of MMR. You may also need a second  dose. Meningococcal conjugate (MenACWY) vaccine  One dose is recommended if you are age 81-21 years and a first-year college student living in a residence hall, or if you have one of several medical conditions. You may also need additional booster doses. Pneumococcal conjugate (PCV13) vaccine  You may need this if you have certain conditions and were not previously vaccinated. Pneumococcal polysaccharide (PPSV23) vaccine  You may need one or two doses if you smoke cigarettes or if you have certain conditions. Hepatitis A vaccine  You may need this if you have certain conditions or if you travel or work in places where you may be exposed to hepatitis A. Hepatitis B vaccine  You may need this if you have certain conditions or if you travel or work in places where you may be exposed to hepatitis B. Haemophilus influenzae type b (Hib) vaccine  You may need this if you have certain conditions. You may receive vaccines as individual doses or as more than one vaccine together in one shot (combination vaccines). Talk with your health care provider about the risks and benefits of combination vaccines. What tests do I need?  Blood tests  Lipid and cholesterol levels. These may be checked every 5 years starting at age 54.  Hepatitis C test.  Hepatitis B test. Screening  Diabetes screening. This is done by checking your blood sugar (glucose) after you have not eaten for a while (fasting).  Sexually transmitted disease (STD) testing.  BRCA-related cancer screening. This may be done if you have a family history of breast, ovarian, tubal, or peritoneal cancers.  Pelvic exam and Pap test. This may be done every 3 years starting at age 26. Starting at age 47, this may be done every 5 years if you have a Pap test in combination with an HPV test. Talk with your health care provider about your test results, treatment options, and if necessary, the need for more tests. Follow these instructions at  home: Eating and drinking   Eat a diet that includes fresh fruits and vegetables, whole grains, lean protein, and low-fat dairy.  Take vitamin and mineral supplements as recommended by your health care provider.  Do not drink alcohol if: ? Your health care provider tells you not to drink. ? You are pregnant, may be pregnant, or are planning to become pregnant.  If you drink alcohol: ? Limit how much you have to 0-1 drink a day. ? Be aware of how much alcohol is in your drink. In the  [place] one drink equals one 12 oz bottle of beer (355 mL), one 5 oz glass of wine (148 mL), or one 1 oz glass of hard liquor (44 mL). Lifestyle  Take daily care of your teeth and gums.  Stay active. Exercise for at least 30 minutes on 5 or more days each week.  Do not use any products that contain nicotine or tobacco, such as cigarettes, e-cigarettes, and chewing tobacco. If you need help quitting, ask your health care provider.  If you are sexually active, practice safe sex. Use a condom or other form of birth control (contraception) in order to prevent pregnancy and STIs (sexually transmitted infections). If you plan to become pregnant, see your health care provider for a preconception visit. What's next?  Visit your health care provider once a year for a well check visit.  Ask your health care provider how often you should have your eyes and teeth checked.  Stay up to date on all vaccines. This information is not intended to replace advice given to you by your health care provider. Make sure you discuss any questions you have with your health care provider. Document Released: 01/06/2002 Document Revised: 07/22/2018 Document Reviewed: 07/22/2018 Elsevier Patient Education  2020 [employer].

## 2019-10-15 LAB — HIV ANTIBODY (ROUTINE TESTING W REFLEX): HIV Screen 4th Generation wRfx: NONREACTIVE

## 2019-10-15 LAB — HEPATITIS B SURFACE ANTIGEN: Hepatitis B Surface Ag: NEGATIVE

## 2019-10-15 LAB — HEPATITIS C ANTIBODY: Hep C Virus Ab: <0.1 s/co ratio (ref 0.0–0.9)

## 2019-10-15 LAB — RPR: RPR Ser Ql: NONREACTIVE

## 2019-10-17 LAB — CERVICOVAGINAL ANCILLARY ONLY
Chlamydia: NEGATIVE
Comment: NEGATIVE
Comment: NEGATIVE
Comment: NORMAL
Neisseria Gonorrhea: NEGATIVE
Trichomonas: NEGATIVE

## 2019-10-18 LAB — CYTOLOGY - PAP: Diagnosis: NEGATIVE

## 2019-11-28 DIAGNOSIS — M5417 Radiculopathy, lumbosacral region: Secondary | ICD-10-CM | POA: Diagnosis not present

## 2019-11-28 DIAGNOSIS — M5386 Other specified dorsopathies, lumbar region: Secondary | ICD-10-CM | POA: Diagnosis not present

## 2019-11-28 DIAGNOSIS — M9902 Segmental and somatic dysfunction of thoracic region: Secondary | ICD-10-CM | POA: Diagnosis not present

## 2019-11-28 DIAGNOSIS — M9905 Segmental and somatic dysfunction of pelvic region: Secondary | ICD-10-CM | POA: Diagnosis not present

## 2019-11-28 DIAGNOSIS — M9903 Segmental and somatic dysfunction of lumbar region: Secondary | ICD-10-CM | POA: Diagnosis not present

## 2019-11-28 DIAGNOSIS — M5414 Radiculopathy, thoracic region: Secondary | ICD-10-CM | POA: Diagnosis not present

## 2019-11-29 DIAGNOSIS — R062 Wheezing: Secondary | ICD-10-CM | POA: Diagnosis not present

## 2019-12-02 DIAGNOSIS — M9905 Segmental and somatic dysfunction of pelvic region: Secondary | ICD-10-CM | POA: Diagnosis not present

## 2019-12-02 DIAGNOSIS — M9902 Segmental and somatic dysfunction of thoracic region: Secondary | ICD-10-CM | POA: Diagnosis not present

## 2019-12-02 DIAGNOSIS — M5386 Other specified dorsopathies, lumbar region: Secondary | ICD-10-CM | POA: Diagnosis not present

## 2019-12-02 DIAGNOSIS — M9903 Segmental and somatic dysfunction of lumbar region: Secondary | ICD-10-CM | POA: Diagnosis not present

## 2019-12-02 DIAGNOSIS — M5414 Radiculopathy, thoracic region: Secondary | ICD-10-CM | POA: Diagnosis not present

## 2019-12-02 DIAGNOSIS — M5417 Radiculopathy, lumbosacral region: Secondary | ICD-10-CM | POA: Diagnosis not present

## 2019-12-05 DIAGNOSIS — M9903 Segmental and somatic dysfunction of lumbar region: Secondary | ICD-10-CM | POA: Diagnosis not present

## 2019-12-05 DIAGNOSIS — M9905 Segmental and somatic dysfunction of pelvic region: Secondary | ICD-10-CM | POA: Diagnosis not present

## 2019-12-05 DIAGNOSIS — M5417 Radiculopathy, lumbosacral region: Secondary | ICD-10-CM | POA: Diagnosis not present

## 2019-12-05 DIAGNOSIS — M5414 Radiculopathy, thoracic region: Secondary | ICD-10-CM | POA: Diagnosis not present

## 2019-12-05 DIAGNOSIS — M5386 Other specified dorsopathies, lumbar region: Secondary | ICD-10-CM | POA: Diagnosis not present

## 2019-12-05 DIAGNOSIS — M9902 Segmental and somatic dysfunction of thoracic region: Secondary | ICD-10-CM | POA: Diagnosis not present

## 2019-12-09 DIAGNOSIS — M5414 Radiculopathy, thoracic region: Secondary | ICD-10-CM | POA: Diagnosis not present

## 2019-12-09 DIAGNOSIS — M9903 Segmental and somatic dysfunction of lumbar region: Secondary | ICD-10-CM | POA: Diagnosis not present

## 2019-12-09 DIAGNOSIS — M5417 Radiculopathy, lumbosacral region: Secondary | ICD-10-CM | POA: Diagnosis not present

## 2019-12-09 DIAGNOSIS — M9902 Segmental and somatic dysfunction of thoracic region: Secondary | ICD-10-CM | POA: Diagnosis not present

## 2019-12-09 DIAGNOSIS — M5386 Other specified dorsopathies, lumbar region: Secondary | ICD-10-CM | POA: Diagnosis not present

## 2019-12-09 DIAGNOSIS — M9905 Segmental and somatic dysfunction of pelvic region: Secondary | ICD-10-CM | POA: Diagnosis not present

## 2019-12-14 DIAGNOSIS — M5417 Radiculopathy, lumbosacral region: Secondary | ICD-10-CM | POA: Diagnosis not present

## 2019-12-14 DIAGNOSIS — M9902 Segmental and somatic dysfunction of thoracic region: Secondary | ICD-10-CM | POA: Diagnosis not present

## 2019-12-14 DIAGNOSIS — M9903 Segmental and somatic dysfunction of lumbar region: Secondary | ICD-10-CM | POA: Diagnosis not present

## 2019-12-14 DIAGNOSIS — M5386 Other specified dorsopathies, lumbar region: Secondary | ICD-10-CM | POA: Diagnosis not present

## 2019-12-14 DIAGNOSIS — M5414 Radiculopathy, thoracic region: Secondary | ICD-10-CM | POA: Diagnosis not present

## 2019-12-14 DIAGNOSIS — M9905 Segmental and somatic dysfunction of pelvic region: Secondary | ICD-10-CM | POA: Diagnosis not present

## 2019-12-16 DIAGNOSIS — M5414 Radiculopathy, thoracic region: Secondary | ICD-10-CM | POA: Diagnosis not present

## 2019-12-16 DIAGNOSIS — M5417 Radiculopathy, lumbosacral region: Secondary | ICD-10-CM | POA: Diagnosis not present

## 2019-12-16 DIAGNOSIS — M9905 Segmental and somatic dysfunction of pelvic region: Secondary | ICD-10-CM | POA: Diagnosis not present

## 2019-12-16 DIAGNOSIS — M9902 Segmental and somatic dysfunction of thoracic region: Secondary | ICD-10-CM | POA: Diagnosis not present

## 2019-12-16 DIAGNOSIS — M9903 Segmental and somatic dysfunction of lumbar region: Secondary | ICD-10-CM | POA: Diagnosis not present

## 2019-12-16 DIAGNOSIS — M5386 Other specified dorsopathies, lumbar region: Secondary | ICD-10-CM | POA: Diagnosis not present

## 2019-12-19 DIAGNOSIS — M9903 Segmental and somatic dysfunction of lumbar region: Secondary | ICD-10-CM | POA: Diagnosis not present

## 2019-12-19 DIAGNOSIS — M9902 Segmental and somatic dysfunction of thoracic region: Secondary | ICD-10-CM | POA: Diagnosis not present

## 2019-12-19 DIAGNOSIS — M5417 Radiculopathy, lumbosacral region: Secondary | ICD-10-CM | POA: Diagnosis not present

## 2019-12-19 DIAGNOSIS — M5386 Other specified dorsopathies, lumbar region: Secondary | ICD-10-CM | POA: Diagnosis not present

## 2019-12-19 DIAGNOSIS — M5414 Radiculopathy, thoracic region: Secondary | ICD-10-CM | POA: Diagnosis not present

## 2019-12-19 DIAGNOSIS — M9905 Segmental and somatic dysfunction of pelvic region: Secondary | ICD-10-CM | POA: Diagnosis not present

## 2020-01-23 DIAGNOSIS — M9905 Segmental and somatic dysfunction of pelvic region: Secondary | ICD-10-CM | POA: Diagnosis not present

## 2020-01-23 DIAGNOSIS — M5386 Other specified dorsopathies, lumbar region: Secondary | ICD-10-CM | POA: Diagnosis not present

## 2020-01-23 DIAGNOSIS — M9903 Segmental and somatic dysfunction of lumbar region: Secondary | ICD-10-CM | POA: Diagnosis not present

## 2020-01-23 DIAGNOSIS — M5414 Radiculopathy, thoracic region: Secondary | ICD-10-CM | POA: Diagnosis not present

## 2020-01-23 DIAGNOSIS — M5417 Radiculopathy, lumbosacral region: Secondary | ICD-10-CM | POA: Diagnosis not present

## 2020-01-23 DIAGNOSIS — M9902 Segmental and somatic dysfunction of thoracic region: Secondary | ICD-10-CM | POA: Diagnosis not present

## 2020-01-24 ENCOUNTER — Other Ambulatory Visit: Payer: Self-pay

## 2020-01-24 ENCOUNTER — Ambulatory Visit: Payer: Medicaid Other | Admitting: Advanced Practice Midwife

## 2020-01-24 VITALS — BP 111/75 | HR 87 | Wt 134.0 lb

## 2020-01-24 DIAGNOSIS — N6011 Diffuse cystic mastopathy of right breast: Secondary | ICD-10-CM

## 2020-01-24 DIAGNOSIS — G8929 Other chronic pain: Secondary | ICD-10-CM | POA: Diagnosis not present

## 2020-01-24 DIAGNOSIS — N644 Mastodynia: Secondary | ICD-10-CM | POA: Diagnosis not present

## 2020-01-24 DIAGNOSIS — M546 Pain in thoracic spine: Secondary | ICD-10-CM | POA: Diagnosis not present

## 2020-01-24 DIAGNOSIS — N6012 Diffuse cystic mastopathy of left breast: Secondary | ICD-10-CM | POA: Diagnosis not present

## 2020-01-24 DIAGNOSIS — M62838 Other muscle spasm: Secondary | ICD-10-CM

## 2020-01-24 NOTE — Progress Notes (Signed)
  GYNECOLOGY PROGRESS NOTE  History:  29 y.o. G1P1001 presents to [facility] office today for problem gyn visit. She reports pain in both breasts x 1 month, worsening in the last 3 days. She reports a change in her breast size from C cup to D in the last few months.  She also has increased her exercise and is cycling almost daily. She wears a supportive well fitting bra daily and a sports bra when exercising.  Her paternal grandmother had breast cancer, treated with double mastectomy.  She reports that when she removes her bra, there is pain in both breasts but the right breast has spasms every 2-3 minutes for sometimes 2-3 hours.  She has not taken any medication for the pain.  She denies h/a, dizziness, shortness of breath, n/v, or fever/chills.    The following portions of the patient's history were reviewed and updated as appropriate: allergies, current medications, past family history, past medical history, past social history, past surgical history and problem list. Last pap smear on 10/14/19 was normal.  Review of Systems:  Pertinent items are noted in HPI.   Objective:  Physical Exam Blood pressure 111/75, pulse 87, weight 134 lb (60.8 kg). VS reviewed, nursing note reviewed,  Constitutional: well developed, well nourished, no distress HEENT: normocephalic CV: normal rate Pulm/chest wall: normal effort Breast Exam:  right breast normal with ropelike small smooth round mobile masses palpable throughout, mostly in right upper outer quadrant and axilla, no skin or nipple changes or axillary nodes, left breast normal with similar smooth round mobile ropelike masses, no skin or nipple changes or axillary nodes Abdomen: soft Neuro: alert and oriented x 3 Skin: warm, dry Psych: affect normal    Assessment & Plan:  1. Breast pain, right --Pain to palpation during today's exam, c/w cysts/fibrocystic changes especially in tail of breast/axilla. - US BREAST COMPLETE UNI RIGHT INC AXILLA;  Future  2. Fibrocystic disease of both breasts --Reassurance provided to pt that this is benign, discussed lifestyle changes in including reducing caffeine which may help.   --Ibuprofen PRN for pain - US BREAST COMPLETE UNI LEFT INC AXILLA; Future - US BREAST COMPLETE UNI RIGHT INC AXILLA; Future  3. Other muscle spasm   4. Pain of left breast  - US BREAST COMPLETE UNI LEFT INC AXILLA; Future   5. Chronic bilateral thoracic back pain --Pt reports increased back pain with change in breast size. If pain does not improve, she would consider breast reduction in the future.  Sharen Counter, CNM 12:24 PM

## 2020-01-24 NOTE — Patient Instructions (Signed)
Fibrocystic Breast Changes  Fibrocystic breast changes are changes in breast tissue that can cause breasts to become swollen, lumpy, or painful. This can happen due to buildup of scar-like tissue (fibrous tissue) or the forming of fluid-filled lumps (cysts) in the breast. This is a common condition, and it is not cancerous (is benign). The exact cause is not known, but it seems to occur when women go through hormonal changes during their menstrual cycle. Fibrocystic breast changes can affect one or both breasts. What are the causes? The exact cause of fibrocystic breast changes is not known. However, this condition:  May be related to the female hormones estrogen and progesterone.  May be influenced by family traits that get passed from parent to child (genetics). What are the signs or symptoms? Symptoms of this condition may affect one or both breasts, and may include:  Tenderness, mild discomfort, or pain.  Swelling.  Rope-like tissue that can be felt when touching the breast.  Lumps in one or both breasts.  Changes in breast size. Breasts may get larger before the menstrual period and smaller after the menstrual period.  Green or dark brown discharge from the nipple. Symptoms are usually worse before menstrual periods start, and they get better toward the end of menstrual periods. How is this diagnosed? This condition is diagnosed based on your medical history and a physical exam of your breasts. You may also have tests, such as:  A breast X-ray (mammogram).  Ultrasound of your breasts.  MRI.  Removal of a breast tissue sample for testing (breast biopsy). This may be done if your health care provider thinks that something else may be causing changes in your breasts. How is this treated? Often, treatment is not needed for this condition. In some cases, treatment may include:  Taking over-the-counter pain relievers to help lessen pain or discomfort.  Limiting or avoiding  caffeine. Foods and beverages that contain caffeine include chocolate, soda, coffee, and tea.  Reducing sugar and fat in your diet. Your health care provider may also recommend:  A procedure to remove fluid from a cyst that is causing pain (fine needle aspiration).  Surgery to remove a cyst that is large or tender or does not go away. Follow these instructions at home:  Examine your breasts after every menstrual period. If you do not have menstrual periods, check your breasts on the first day of every month. Feel for changes in your breasts, such as: ? More tenderness. ? A new growth. ? A change in size. ? A change in an existing lump.  Take over-the-counter and prescription medicines only as told by your health care provider.  Wear a well-fitted support or sports bra, especially when exercising.  Decrease or avoid caffeine, fat, and sugar in your diet as directed by your health care provider. Contact a health care provider if:  You have fluid leaking from your nipple, especially if it is bloody.  You have new lumps or bumps in your breast.  Your breast becomes enlarged, red, and painful.  You have areas of your breast that pucker inward.  Your nipple appears flat or indented. Get help right away if:  You have redness of your breast and the redness is spreading. Summary  Fibrocystic breast changes are changes in breast tissue that can cause breasts to become swollen, lumpy, or painful.  This condition may be related to the female hormones estrogen and progesterone.  With this condition, it is important to examine your breasts after every   menstrual period. If you do not have menstrual periods, check your breasts on the first day of every month. This information is not intended to replace advice given to you by your health care provider. Make sure you discuss any questions you have with your health care provider. Document Revised: 10/23/2017 Document Reviewed:  07/09/2016 Elsevier Patient Education  2020 Elsevier Inc.  

## 2020-01-26 ENCOUNTER — Other Ambulatory Visit: Payer: Self-pay | Admitting: Advanced Practice Midwife

## 2020-02-03 ENCOUNTER — Other Ambulatory Visit: Payer: Medicaid Other

## 2020-02-03 ENCOUNTER — Ambulatory Visit
Admission: RE | Admit: 2020-02-03 | Discharge: 2020-02-03 | Disposition: A | Discharge status: Home / Self Care | Payer: Medicaid Other | Source: Ambulatory Visit | Attending: Advanced Practice Midwife | Admitting: Advanced Practice Midwife

## 2020-02-03 ENCOUNTER — Other Ambulatory Visit: Payer: Self-pay

## 2020-02-03 DIAGNOSIS — N6011 Diffuse cystic mastopathy of right breast: Secondary | ICD-10-CM

## 2020-02-03 DIAGNOSIS — N6012 Diffuse cystic mastopathy of left breast: Secondary | ICD-10-CM

## 2020-02-03 DIAGNOSIS — N644 Mastodynia: Secondary | ICD-10-CM

## 2020-07-05 ENCOUNTER — Ambulatory Visit
Admission: EM | Admit: 2020-07-05 | Discharge: 2020-07-05 | Disposition: A | Discharge status: Home / Self Care | Payer: Medicaid Other

## 2020-07-05 ENCOUNTER — Other Ambulatory Visit: Payer: Self-pay

## 2020-07-05 DIAGNOSIS — M7989 Other specified soft tissue disorders: Secondary | ICD-10-CM

## 2020-07-05 DIAGNOSIS — S90862A Insect bite (nonvenomous), left foot, initial encounter: Secondary | ICD-10-CM

## 2020-07-05 NOTE — ED Provider Notes (Signed)
[facility]    CSN: [id number] Arrival date & time: 07/05/20  1426      History   Chief Complaint Chief Complaint  Patient presents with  . Insect Bite    HPI Keshona Kartes is a 29 y.o. female.   29 year old female comes in for left foot swelling and pain after insect bite 2 days ago.  Had itching at first, now with pain, pressure, left foot swelling.  Denies erythema, fever.  Area was at first warm to touch, but now resolved.  Hydrocortisone cream, ice compress without relief.     Past Medical History:  Diagnosis Date  . Cellulitis 2012   in right foot  . UTI (urinary tract infection)     Patient Active Problem List   Diagnosis Date Noted  . IDA (iron deficiency anemia) 05/16/2016  . Abortion history 01/23/2016  . Brittle hair 01/30/2015    Past Surgical History:  Procedure Laterality Date  . DILATION AND CURETTAGE OF UTERUS  December 2016  . TONSILECTOMY, ADENOIDECTOMY, BILATERAL MYRINGOTOMY AND TUBES      OB History    Gravida  1   Para  1   Term  1   Preterm  0   AB  0   Living  1     SAB  0   TAB  0   Ectopic  0   Multiple  0   Live Births  1            Home Medications    Prior to Admission medications   Not on File    Family History Family History  Problem Relation Age of Onset  . Cancer Paternal Grandmother     Social History Social History   Tobacco Use  . Smoking status: Former Games developer  . Smokeless tobacco: Former Neurosurgeon    Quit date: 08/25/2015  . Tobacco comment: 3-5 cigarretts per day  Substance Use Topics  . Alcohol use: Yes    Comment: Ocassionally  . Drug use: No     Allergies   Patient has no known allergies.   Review of Systems Review of Systems  Reason unable to perform ROS: See HPI as above.     Physical Exam Triage Vital Signs ED Triage Vitals  Enc Vitals Group     BP 07/05/20 1508 (!) 137/93     Pulse Rate 07/05/20 1508 75     Resp 07/05/20 1508 16     Temp 07/05/20  1508 99.1 F (37.3 C)     Temp Source 07/05/20 1508 Oral     SpO2 07/05/20 1508 99 %     Weight --      Height --      Head Circumference --      Peak Flow --      Pain Score 07/05/20 1520 0     Pain Loc --      Pain Edu? --      Excl. in GC? --    No data found.  Updated Vital Signs BP (!) 137/93 (BP Location: Left Arm)   Pulse 75   Temp 99.1 F (37.3 C) (Oral)   Resp 16   LMP 07/05/2020   SpO2 99%   Physical Exam Constitutional:      General: She is not in acute distress.    Appearance: Normal appearance. She is well-developed. She is not toxic-appearing or diaphoretic.  HENT:     Head: Normocephalic and atraumatic.  Eyes:  Conjunctiva/sclera: Conjunctivae normal.     Pupils: Pupils are equal, round, and reactive to light.  Pulmonary:     Effort: Pulmonary effort is normal. No respiratory distress.  Musculoskeletal:     Cervical back: Normal range of motion and neck supple.  Skin:    General: Skin is warm and dry.     Comments: Diffuse left foot and ankle swelling without pitting edema. No erythema, warmth, induration. Diffuse tenderness to palpation. Full ROM of ankle. NVI.   Neurological:     Mental Status: She is alert and oriented to person, place, and time.      UC Treatments / Results  Labs (all labs ordered are listed, but only abnormal results are displayed) Labs Reviewed - No data to display  EKG   Radiology No results found.  Procedures Procedures (including critical care time)  Medications Ordered in UC Medications - No data to display  Initial Impression / Assessment and Plan / UC Course  I have reviewed the triage vital signs and the nursing notes.  Pertinent labs & imaging results that were available during my care of the patient were reviewed by me and considered in my medical decision making (see chart for details).    No signs of cellulitis on exam.  Discussed likely inflammatory reaction.  Discussed symptomatic treatment  including prednisone, ice compress, Ace wrap.  Patient stating has prednisone course that she never started for contact dermatitis that occurred recently. Course of prednisone prescribed at the time was 40mg  daily x 3 days. Discussed can start this course, if needed, we can extend course to 5 days total. Return precautions given.  Final Clinical Impressions(s) / UC Diagnoses   Final diagnoses:  Swelling of left foot  Insect bite of left foot, initial encounter   ED Prescriptions    None     PDMP not reviewed this encounter.   , PA-C 07/05/20 1610

## 2020-07-05 NOTE — Discharge Instructions (Signed)
Prednisone as directed. Ice compress, elevation, ace wrap during activity. Monitor for spreading redness, warmth, pain follow up for reevaluation.

## 2020-07-05 NOTE — ED Triage Notes (Signed)
Pt c/o bug bite to the top of lt foot on Tuesday. States since wednesday having swelling, redness, itching, and painful.

## 2020-08-13 ENCOUNTER — Other Ambulatory Visit: Payer: Self-pay

## 2020-08-13 ENCOUNTER — Ambulatory Visit (INDEPENDENT_AMBULATORY_CARE_PROVIDER_SITE_OTHER): Payer: Medicaid Other | Admitting: Women's Health

## 2020-08-13 ENCOUNTER — Encounter: Payer: Self-pay | Admitting: Women's Health

## 2020-08-13 ENCOUNTER — Other Ambulatory Visit (HOSPITAL_COMMUNITY)
Admission: RE | Admit: 2020-08-13 | Discharge: 2020-08-13 | Disposition: A | Discharge status: Home / Self Care | Payer: Medicaid Other | Source: Ambulatory Visit | Attending: Women's Health | Admitting: Women's Health

## 2020-08-13 VITALS — BP 131/93 | HR 71 | Ht 63.0 in | Wt 120.0 lb

## 2020-08-13 DIAGNOSIS — N898 Other specified noninflammatory disorders of vagina: Secondary | ICD-10-CM | POA: Insufficient documentation

## 2020-08-13 DIAGNOSIS — N939 Abnormal uterine and vaginal bleeding, unspecified: Secondary | ICD-10-CM | POA: Diagnosis not present

## 2020-08-13 NOTE — Progress Notes (Signed)
GYN presents for IUD check. Paragard was inserted 4 years ago.  C/o brown mucousy discharge/spotting, pelvic pain 2-3/10 started after sex.  Denies odor, fever, chills, NV.

## 2020-08-13 NOTE — Patient Instructions (Signed)
Abnormal Uterine Bleeding °Abnormal uterine bleeding is unusual bleeding from the uterus. It includes: °· Bleeding or spotting between periods. °· Bleeding after sex. °· Bleeding that is heavier than normal. °· Periods that last longer than usual. °· Bleeding after menopause. °Abnormal uterine bleeding can affect women at various stages in life, including teenagers, women in their reproductive years, pregnant women, and women who have reached menopause. Common causes of abnormal uterine bleeding include: °· Pregnancy. °· Growths of tissue (polyps). °· A noncancerous tumor in the uterus (fibroid). °· Infection. °· Cancer. °· Hormonal imbalances. °Any type of abnormal bleeding should be evaluated by a health care provider. Many cases are minor and simple to treat, while others are more serious. Treatment will depend on the cause of the bleeding. °Follow these instructions at home: °· Monitor your condition for any changes. °· Do not use tampons, douche, or have sex if told by your health care provider. °· Change your pads often. °· Get regular exams that include pelvic exams and cervical cancer screening. °· Keep all follow-up visits as told by your health care provider. This is important. °Contact a health care provider if: °· Your bleeding lasts for more than one week. °· You feel dizzy at times. °· You feel nauseous or you vomit. °Get help right away if: °· You pass out. °· Your bleeding soaks through a pad every hour. °· You have abdominal pain. °· You have a fever. °· You become sweaty or weak. °· You pass large blood clots from your vagina. °Summary °· Abnormal uterine bleeding is unusual bleeding from the uterus. °· Any type of abnormal bleeding should be evaluated by a health care provider. Many cases are minor and simple to treat, while others are more serious. °· Treatment will depend on the cause of the bleeding. °This information is not intended to replace advice given to you by your health care provider.  Make sure you discuss any questions you have with your health care provider. °Document Revised: 02/17/2018 Document Reviewed: 12/12/2016 °Elsevier Patient Education © 2020 Elsevier Inc. ° °

## 2020-08-13 NOTE — Progress Notes (Signed)
GYNECOLOGY OFFICE VISIT NOTE  History:  29 y.o. G1P1001 here today for IUD string check. Paragard IUD was placed 4 years ago. Patient reports about two weeks ago after a sexual encounter involving fingers inserted into her vagina that she started experiencing dark brown mucus-like discharge that she has since seen in her underwear as well as after wiping with every episode of using the bathroom. She denies any other abnormal vaginal discharge, pelvic pain or other concerns. Having spotting/bleeding per above. No dyspareunia.  Last Pap performed 09/2019 - NILM.  Past Medical History:  Diagnosis Date  . Cellulitis 2012   in right foot  . UTI (urinary tract infection)     Past Surgical History:  Procedure Laterality Date  . DILATION AND CURETTAGE OF UTERUS  December 2016  . TONSILECTOMY, ADENOIDECTOMY, BILATERAL MYRINGOTOMY AND TUBES      The following portions of the patient's history were reviewed and updated as appropriate: allergies, current medications, past family history, past medical history, past social history, past surgical history and problem list.   Review of Systems:  Pertinent items noted in HPI and remainder of comprehensive ROS otherwise negative.  Objective:  Physical Exam BP (!) 131/93   Pulse 71   Ht 5\' 3"  (1.6 m)   Wt 120 lb (54.4 kg)   BMI 21.26 kg/m  CONSTITUTIONAL: Well-developed, well-nourished female in no acute distress.  HENT:  Normocephalic, atraumatic.  SKIN: Skin is warm and dry. No rash noted. Not diaphoretic. No erythema. No pallor. NEUROLOGIC: Alert and oriented to person, place, and time.  PSYCHIATRIC: Normal mood and affect. Normal behavior. Normal judgment and thought content. CARDIOVASCULAR: Normal heart rate noted RESPIRATORY: Effort and rate normal ABDOMEN: Soft, no distention noted.   PELVIC: Normal appearing external genitalia; normal appearing vaginal mucosa and cervix. IUD string seen, subjectively shortened. Moderate amount of dark  brown, thick, mucus-like discharge present in vagina, no active bleeding noted, but lower portion of cervix is friable.  Assessment & Plan:  1. Vaginal discharge - Cervicovaginal ancillary only( [facility])  2. Abnormal uterine bleeding - PELVIC COMPLETE WITH TRANSVAGINAL; Future - UPT negative, pt expecting period at end of this month   Please refer to After Visit Summary for other counseling recommendations.   Return for needs pelvic US scheduled at earliest convenience for patient.   [place], NP 08/13/2020 3:44 PM

## 2020-08-14 LAB — CERVICOVAGINAL ANCILLARY ONLY
Bacterial Vaginitis (gardnerella): NEGATIVE
Candida Glabrata: NEGATIVE
Candida Vaginitis: NEGATIVE
Chlamydia: NEGATIVE
Comment: NEGATIVE
Comment: NEGATIVE
Comment: NEGATIVE
Comment: NEGATIVE
Comment: NEGATIVE
Comment: NORMAL
Neisseria Gonorrhea: NEGATIVE
Trichomonas: NEGATIVE

## 2020-08-14 NOTE — Progress Notes (Signed)
Please call patient to let her know gonorrhea, chlamydia, trichomonas, BV and yeast testing was normal. Patient does not have MyChart. Thank you!, Joni Reining

## 2020-08-21 ENCOUNTER — Other Ambulatory Visit: Payer: Self-pay

## 2020-08-21 ENCOUNTER — Ambulatory Visit
Admission: RE | Admit: 2020-08-21 | Discharge: 2020-08-21 | Disposition: A | Discharge status: Home / Self Care | Payer: Medicaid Other | Source: Ambulatory Visit | Attending: Women's Health | Admitting: Women's Health

## 2020-08-21 DIAGNOSIS — N939 Abnormal uterine and vaginal bleeding, unspecified: Secondary | ICD-10-CM | POA: Insufficient documentation

## 2020-09-28 ENCOUNTER — Other Ambulatory Visit: Payer: Self-pay

## 2020-09-28 DIAGNOSIS — N898 Other specified noninflammatory disorders of vagina: Secondary | ICD-10-CM

## 2020-09-28 MED ORDER — FLUCONAZOLE 150 MG PO TABS: 150.0000 mg | ORAL_TABLET | Freq: Once | ORAL | 0 refills | Status: AC

## 2020-09-28 MED ORDER — METRONIDAZOLE 500 MG PO TABS: 500.0000 mg | ORAL_TABLET | Freq: Two times a day (BID) | ORAL | 0 refills | Status: DC

## 2020-09-28 NOTE — Progress Notes (Signed)
Rx sent per protocol for BV. And yeast Pt confirmed pharmacy made aware if sx do not clear in 7-14 days contact office for a vaginal swab.  Pt agreeable.

## 2020-10-02 DIAGNOSIS — F439 Reaction to severe stress, unspecified: Secondary | ICD-10-CM | POA: Diagnosis not present

## 2020-10-02 DIAGNOSIS — R0789 Other chest pain: Secondary | ICD-10-CM | POA: Diagnosis not present

## 2020-10-02 DIAGNOSIS — R030 Elevated blood-pressure reading, without diagnosis of hypertension: Secondary | ICD-10-CM | POA: Diagnosis not present

## 2020-12-03 DIAGNOSIS — Z1152 Encounter for screening for COVID-19: Secondary | ICD-10-CM | POA: Diagnosis not present

## 2021-02-07 ENCOUNTER — Other Ambulatory Visit (HOSPITAL_COMMUNITY)
Admission: RE | Admit: 2021-02-07 | Discharge: 2021-02-07 | Disposition: A | Discharge status: Home / Self Care | Payer: Medicaid Other | Source: Ambulatory Visit | Attending: Obstetrics | Admitting: Obstetrics

## 2021-02-07 ENCOUNTER — Other Ambulatory Visit: Payer: Self-pay

## 2021-02-07 ENCOUNTER — Ambulatory Visit (INDEPENDENT_AMBULATORY_CARE_PROVIDER_SITE_OTHER): Payer: Medicaid Other

## 2021-02-07 VITALS — BP 133/90 | HR 72

## 2021-02-07 DIAGNOSIS — Z7251 High risk heterosexual behavior: Secondary | ICD-10-CM | POA: Insufficient documentation

## 2021-02-07 LAB — POCT URINALYSIS DIPSTICK (MANUAL)
Leukocytes, UA: NEGATIVE
Nitrite, UA: NEGATIVE
Poct Bilirubin: NEGATIVE
Poct Blood: 50 — AB
Poct Glucose: NORMAL mg/dL
Poct Ketones: NEGATIVE
Poct Protein: NEGATIVE mg/dL
Poct Urobilinogen: NORMAL mg/dL
Spec Grav, UA: 1.01 (ref 1.010–1.025)
pH, UA: 5 (ref 5.0–8.0)

## 2021-02-07 NOTE — Progress Notes (Signed)
Patient was assessed and managed by nursing staff during this encounter. I have reviewed the chart and agree with the documentation and plan. I have also made any necessary editorial changes.  Catalina Antigua, MD 02/07/2021 10:55 AM

## 2021-02-07 NOTE — Progress Notes (Signed)
SUBJECTIVE:  30 y.o. female complains of vaginal discharge.She reported having a one night stand and did not use protection.  Denies abnormal vaginal bleeding or significant pelvic pain or fever. No UTI symptoms. Denies history of known exposure to STD.  No LMP recorded. (Menstrual status: IUD).  OBJECTIVE:  She appears well, afebrile. Urine dipstick: normal   ASSESSMENT:  Vaginal Discharge:Patient just started her period Vaginal Odor : no    PLAN:  GC, chlamydia, trichomonas, BVAG, CVAG probe sent to lab. Treatment: To be determined once lab results are received ROV prn if symptoms persist or worsen.

## 2021-02-08 LAB — CERVICOVAGINAL ANCILLARY ONLY
Bacterial Vaginitis (gardnerella): NEGATIVE
Candida Glabrata: NEGATIVE
Candida Vaginitis: POSITIVE — AB
Chlamydia: NEGATIVE
Comment: NEGATIVE
Comment: NEGATIVE
Comment: NEGATIVE
Comment: NEGATIVE
Comment: NEGATIVE
Comment: NORMAL
Neisseria Gonorrhea: NEGATIVE
Trichomonas: NEGATIVE

## 2021-02-11 ENCOUNTER — Other Ambulatory Visit: Payer: Self-pay | Admitting: Obstetrics and Gynecology

## 2021-02-11 MED ORDER — FLUCONAZOLE 150 MG PO TABS: 150.0000 mg | ORAL_TABLET | Freq: Once | ORAL | 0 refills | Status: AC

## 2021-02-19 ENCOUNTER — Other Ambulatory Visit: Payer: Self-pay

## 2021-02-19 MED ORDER — FLUCONAZOLE 150 MG PO TABS: 150.0000 mg | ORAL_TABLET | Freq: Once | ORAL | 0 refills | Status: AC

## 2021-02-19 NOTE — Telephone Encounter (Signed)
Patient is requesting a refill on diflucan. I have advised her to wait 2-3 days after taking the first one dose to help with symptoms.

## 2021-04-03 ENCOUNTER — Ambulatory Visit: Payer: Medicaid Other | Admitting: Women's Health

## 2021-04-03 ENCOUNTER — Ambulatory Visit (INDEPENDENT_AMBULATORY_CARE_PROVIDER_SITE_OTHER): Payer: Medicaid Other | Admitting: Obstetrics and Gynecology

## 2021-04-03 ENCOUNTER — Other Ambulatory Visit: Payer: Self-pay | Admitting: Obstetrics

## 2021-04-03 ENCOUNTER — Other Ambulatory Visit: Payer: Self-pay

## 2021-04-03 VITALS — BP 120/85 | HR 84 | Ht 63.0 in | Wt 134.0 lb

## 2021-04-03 DIAGNOSIS — N926 Irregular menstruation, unspecified: Secondary | ICD-10-CM

## 2021-04-03 DIAGNOSIS — R232 Flushing: Secondary | ICD-10-CM

## 2021-04-03 DIAGNOSIS — R61 Generalized hyperhidrosis: Secondary | ICD-10-CM | POA: Diagnosis not present

## 2021-04-03 DIAGNOSIS — R4586 Emotional lability: Secondary | ICD-10-CM | POA: Diagnosis not present

## 2021-04-03 DIAGNOSIS — Z1329 Encounter for screening for other suspected endocrine disorder: Secondary | ICD-10-CM | POA: Diagnosis not present

## 2021-04-03 DIAGNOSIS — Z131 Encounter for screening for diabetes mellitus: Secondary | ICD-10-CM | POA: Diagnosis not present

## 2021-04-03 NOTE — Progress Notes (Signed)
Obstetrics and Gynecology Established Patient Evaluation  Appointment Date: 04/03/2021  OB[facility]N Clinic: [facility]  Referring Provider: Self  Chief Complaint: patient concerned for early menopause  History of Present Illness: Beverly Myers is a 30 y.o. G1P1001 (Patient's last menstrual period was 03/28/2021 (exact date).), seen for the above chief complaint. Her past medical history is significant for paragard IUD in place  She states that for the past 6-7 months she's had mood swings and hot flashes and night sweats.  She states she was started on buspar and then an SSRI was added on another meds used to titrate her mood but this just made her feel worse and she came off of them completely  For the past 2-3 years she states she's had irregular periods that come on about every 2-3 months and sometimes she has some spotting in between her periods.  She was seen in September 2021 for some d/c and strings seemed a little short so an u/s ordered which was negative and showed the iud in the correct location; her stripe was 39mm. Vaginal swab then and in march 2022 was negative except for yeast in march. Paragard was placed about 4-5 years ago.   Review of Systems: Pertinent items noted in HPI and remainder of comprehensive ROS otherwise negative.   Past Medical History:  Past Medical History:  Diagnosis Date  . Cellulitis 2012   in right foot  . UTI (urinary tract infection)     Past Surgical History:  Past Surgical History:  Procedure Laterality Date  . DILATION AND CURETTAGE OF UTERUS  December 2016  . TONSILECTOMY, ADENOIDECTOMY, BILATERAL MYRINGOTOMY AND TUBES      Past Obstetrical History:  OB History  Gravida Para Term Preterm AB Living  1 1 1  0 0 1  SAB IAB Ectopic Multiple Live Births  0 0 0 0 1    # Outcome Date GA Lbr Len/2nd Weight Sex Delivery Anes PTL Lv  1 Term 06/09/12 [redacted]w[redacted]d 03:48 / 02:25 7 lb 14.1 oz (3.575 kg) M Vag-Spont EPI, Local  LIV    Past Gynecological  History: As per HPI. History of Pap Smear(s): Yes.   Last pap 09/2019, which was negative  Social History:  Social History   Socioeconomic History  . Marital status: Single    Spouse name: Not on file  . Number of children: Not on file  . Years of education: Not on file  . Highest education level: Not on file  Occupational History  . Not on file  Tobacco Use  . Smoking status: Former 10/2019  . Smokeless tobacco: Former Games developer    Quit date: 08/25/2015  . Tobacco comment: 3-5 cigarretts per day  Substance and Sexual Activity  . Alcohol use: Yes    Comment: Ocassionally  . Drug use: No  . Sexual activity: Yes    Partners: Male    Birth control/protection: I.U.D.  Other Topics Concern  . Not on file  Social History Narrative  . Not on file   Social Determinants of Health   Financial Resource Strain: Not on file  Food Insecurity: Not on file  Transportation Needs: Not on file  Physical Activity: Not on file  Stress: Not on file  Social Connections: Not on file  Intimate Partner Violence: Not on file    Family History:  Family History  Problem Relation Age of Onset  . Cancer Paternal Grandmother     Medications Paragard  Allergies Patient has no known allergies.   Physical Exam:  BP 120/85   Pulse 84   Ht 5\' 3"  (1.6 m)   Wt 134 lb (60.8 kg)   LMP 03/28/2021 (Exact Date)   BMI 23.74 kg/m  Body mass index is 23.74 kg/m.  General appearance: Well nourished, well developed female in no acute distress.  Respiratory:  Clear to auscultation bilateral. Normal respiratory effort Neuro/Psych:  Normal mood and affect.   Laboratory: none  Radiology: none  Assessment: pt stable  Plan:  1. Hot flashes Follow up labs - TSH - Estradiol - Follicle stimulating hormone - Beta hCG quant (ref lab) - CBC - Comprehensive metabolic panel - Hemoglobin A1c  2. Night sweat - TSH - Estradiol - Follicle stimulating hormone - Beta hCG quant (ref lab) - CBC -  Comprehensive metabolic panel - Hemoglobin A1c  3. Mood swings - TSH - Estradiol - Follicle stimulating hormone - Beta hCG quant (ref lab) - CBC - Comprehensive metabolic panel - Hemoglobin A1c  4. Irregular periods  Orders Placed This Encounter  Procedures  . TSH  . Estradiol  . Follicle stimulating hormone  . Beta hCG quant (ref lab)  . CBC  . Comprehensive metabolic panel  . Hemoglobin A1c    RTC PRN  05/28/2021 MD Attending Center for [facility])

## 2021-04-03 NOTE — Progress Notes (Signed)
GYN presents for irregular periods, skipped 2 months, lighter, hot flashes, mood swings, night sweats. LMP  03/28/2021 which was heavy.  She has Paragard for [facility].  Last PAP 10/14/2019

## 2021-04-04 LAB — COMPREHENSIVE METABOLIC PANEL
ALT: 8 IU/L (ref 0–32)
AST: 14 IU/L (ref 0–40)
Albumin/Globulin Ratio: 1.5 (ref 1.2–2.2)
Albumin: 4.4 g/dL (ref 3.9–5.0)
Alkaline Phosphatase: 53 IU/L (ref 44–121)
BUN/Creatinine Ratio: 14 (ref 9–23)
BUN: 9 mg/dL (ref 6–20)
Bilirubin Total: 0.3 mg/dL (ref 0.0–1.2)
CO2: 21 mmol/L (ref 20–29)
Calcium: 9.4 mg/dL (ref 8.7–10.2)
Chloride: 101 mmol/L (ref 96–106)
Creatinine, Ser: 0.64 mg/dL (ref 0.57–1.00)
Globulin, Total: 3 g/dL (ref 1.5–4.5)
Glucose: 93 mg/dL (ref 65–99)
Potassium: 3.5 mmol/L (ref 3.5–5.2)
Sodium: 141 mmol/L (ref 134–144)
Total Protein: 7.4 g/dL (ref 6.0–8.5)
eGFR: 123 mL/min/{1.73_m2} (ref >59)

## 2021-04-04 LAB — HEMOGLOBIN A1C
Est. average glucose Bld gHb Est-mCnc: 91 mg/dL
Hgb A1c MFr Bld: 4.8 % (ref 4.8–5.6)

## 2021-04-04 LAB — BETA HCG QUANT (REF LAB): hCG Quant: <1 m[IU]/mL

## 2021-04-04 LAB — TSH: TSH: 1.09 u[IU]/mL (ref 0.450–4.500)

## 2021-04-04 LAB — CBC
Hematocrit: 36 % (ref 34.0–46.6)
Hemoglobin: 12 g/dL (ref 11.1–15.9)
MCH: 31.9 pg (ref 26.6–33.0)
MCHC: 33.3 g/dL (ref 31.5–35.7)
MCV: 96 fL (ref 79–97)
Platelets: 247 10*3/uL (ref 150–450)
RBC: 3.76 x10E6/uL — ABNORMAL LOW (ref 3.77–5.28)
RDW: 13.1 % (ref 11.7–15.4)
WBC: 4.8 10*3/uL (ref 3.4–10.8)

## 2021-04-04 LAB — ESTRADIOL: Estradiol: 177 pg/mL

## 2021-04-04 LAB — FOLLICLE STIMULATING HORMONE: FSH: 11.2 m[IU]/mL

## 2021-04-09 ENCOUNTER — Other Ambulatory Visit: Payer: Self-pay

## 2021-04-09 DIAGNOSIS — N760 Acute vaginitis: Secondary | ICD-10-CM

## 2021-04-09 DIAGNOSIS — B9689 Other specified bacterial agents as the cause of diseases classified elsewhere: Secondary | ICD-10-CM

## 2021-04-09 MED ORDER — METRONIDAZOLE 500 MG PO TABS: 500.0000 mg | ORAL_TABLET | Freq: Two times a day (BID) | ORAL | 0 refills | Status: DC

## 2021-05-20 ENCOUNTER — Ambulatory Visit: Payer: Medicaid Other | Admitting: Obstetrics and Gynecology

## 2021-09-10 IMAGING — US US BREAST*L* LIMITED INC AXILLA
1 series · 3 of 3 positions shown · non-contrast
Comparison: None

CLINICAL DATA: 28-year-old patient with bilateral breast pain.

EXAM:
ULTRASOUND OF THE BILATERAL BREAST

[Series 1: us breast*left* limited inc axilla · 0.07mm/px · 3 of 3 slices shown]
[im 1/3]
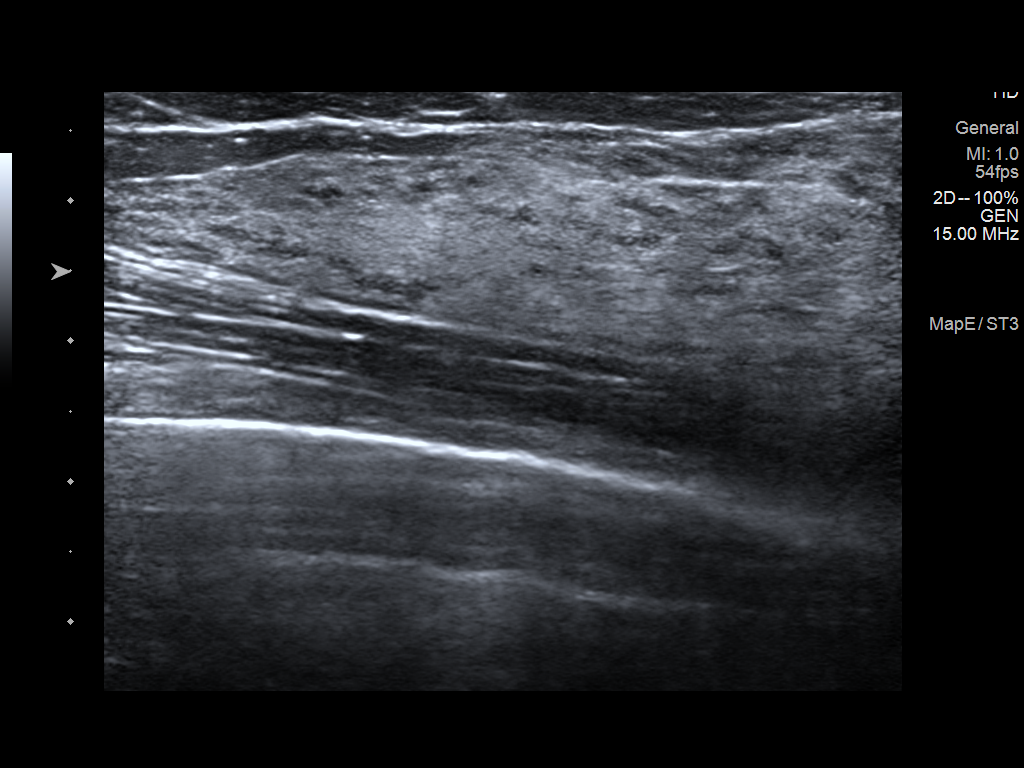
[im 2/3]
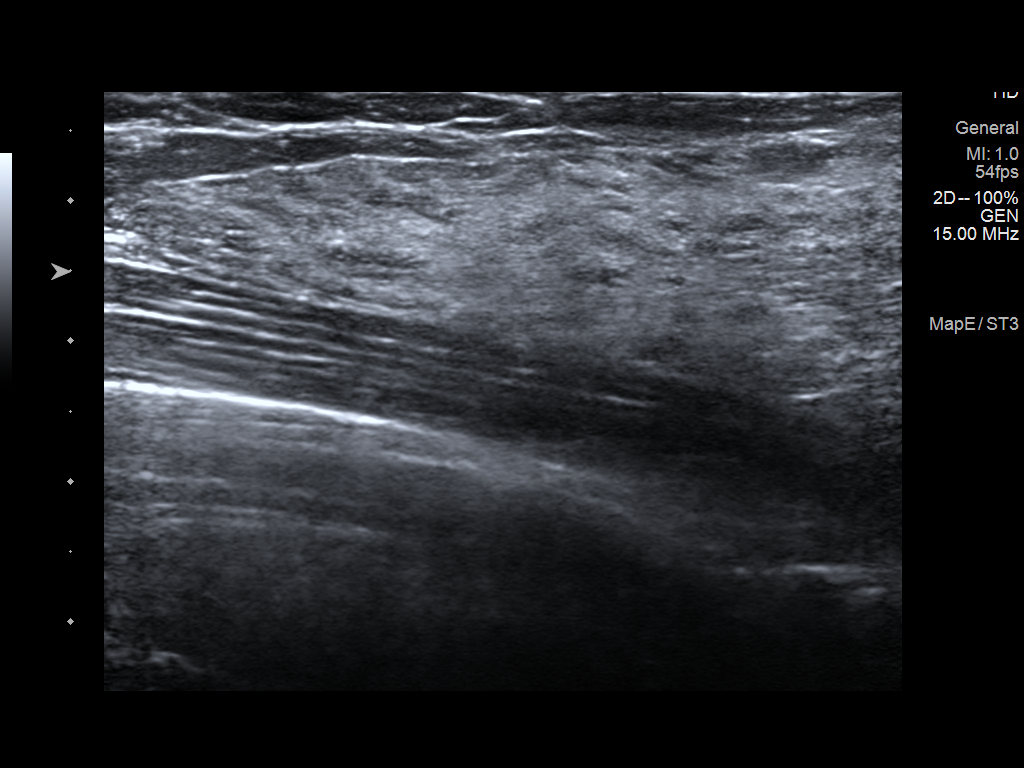
[im 3/3]
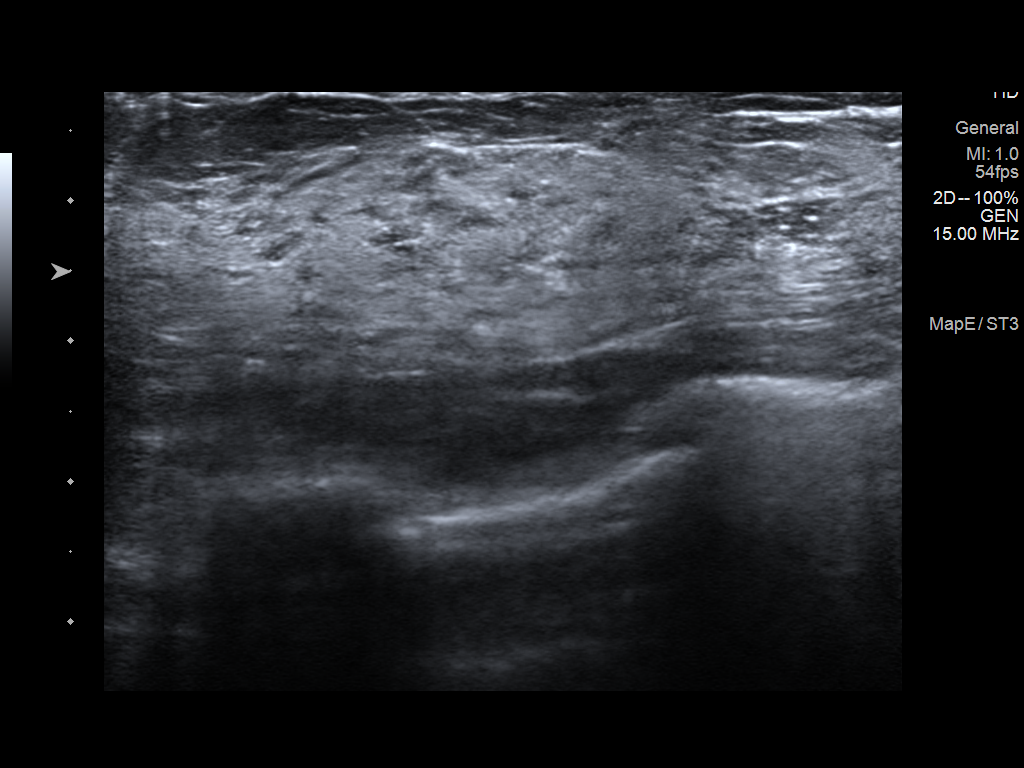

[3 of 3 positions shown; findings below may reference images not displayed]

FINDINGS: RIGHT breast: Targeted ultrasound is performed, evaluating the upper
RIGHT breast, with particular attention to the 11-12 o'clock axes as
directed by the patient, showing only normal fibroglandular tissues
and fat lobules throughout. There is a ridge of normal dense
fibroglandular tissue at the 12 o'clock axis, corresponding to the
area of clinical concern. No solid or cystic mass.

LEFT breast: Targeted ultrasound is performed, evaluating the upper
LEFT breast, with particular attention to the 2 o'clock axis as
directed by the patient, showing only normal fibroglandular tissues
and fat lobules throughout. There is a ridge of normal dense
fibroglandular tissue at the 2 o'clock axis, 8 cm from the nipple,
corresponding to the area of clinical concern. No solid or cystic
mass.
IMPRESSION: No evidence of malignancy within either breast.

RECOMMENDATION:
1. Screening mammogram at age 40 unless there are persistent or
intervening clinical concerns. (Code:63-M-YVG)
2. Benign causes of breast pain, and possible remedies, were
discussed with the patient. Patient was encouraged to follow-up with
referring physician if pain became localized and persistent or if a
palpable lump/mass developed.

I have discussed the findings and recommendations with the patient.
If applicable, a reminder letter will be sent to the patient
regarding the next appointment.

BI-RADS CATEGORY  1: Negative.

## 2021-09-10 IMAGING — US US BREAST*R* LIMITED INC AXILLA
1 series · 4 of 4 positions shown · non-contrast
Comparison: None

CLINICAL DATA: 28-year-old patient with bilateral breast pain.

EXAM:
ULTRASOUND OF THE BILATERAL BREAST

[Series 1: us breast*right* limited inc axilla · 0.07mm/px · 4 of 4 slices shown]
[im 1/4]
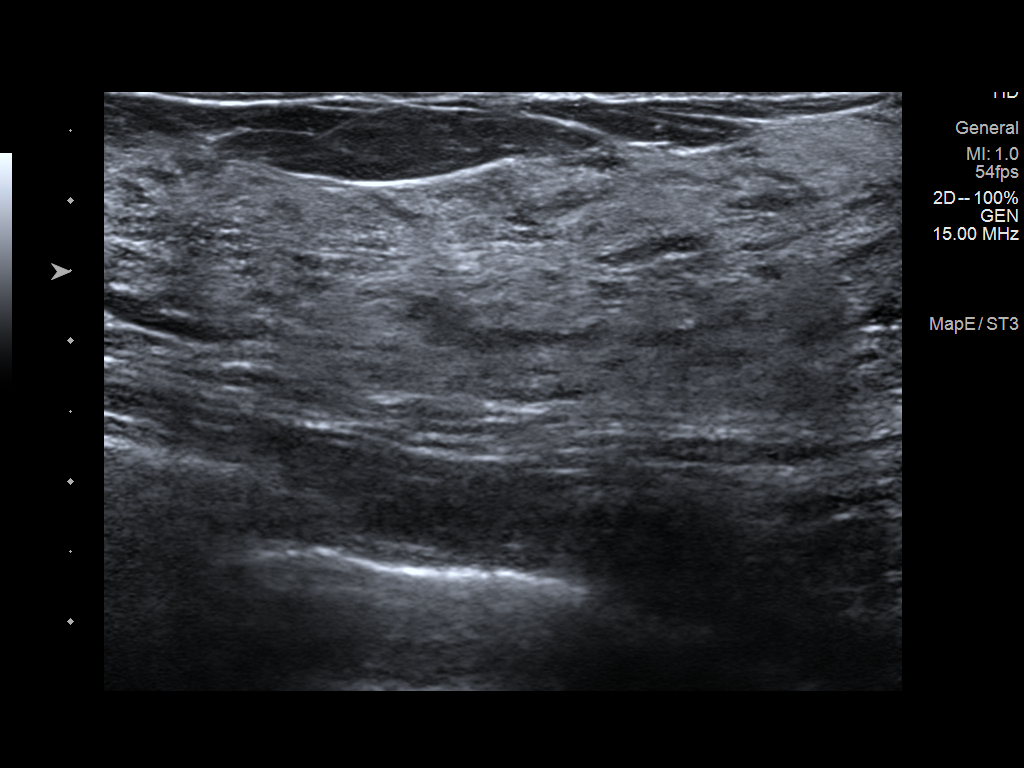
[im 2/4]
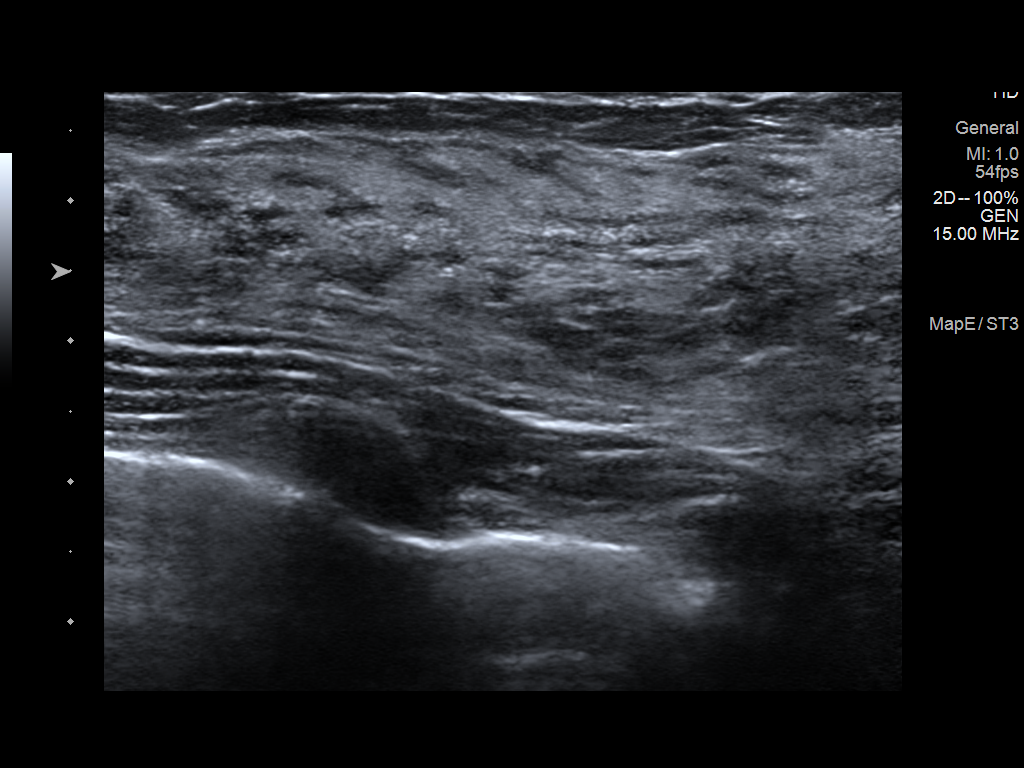
[im 3/4]
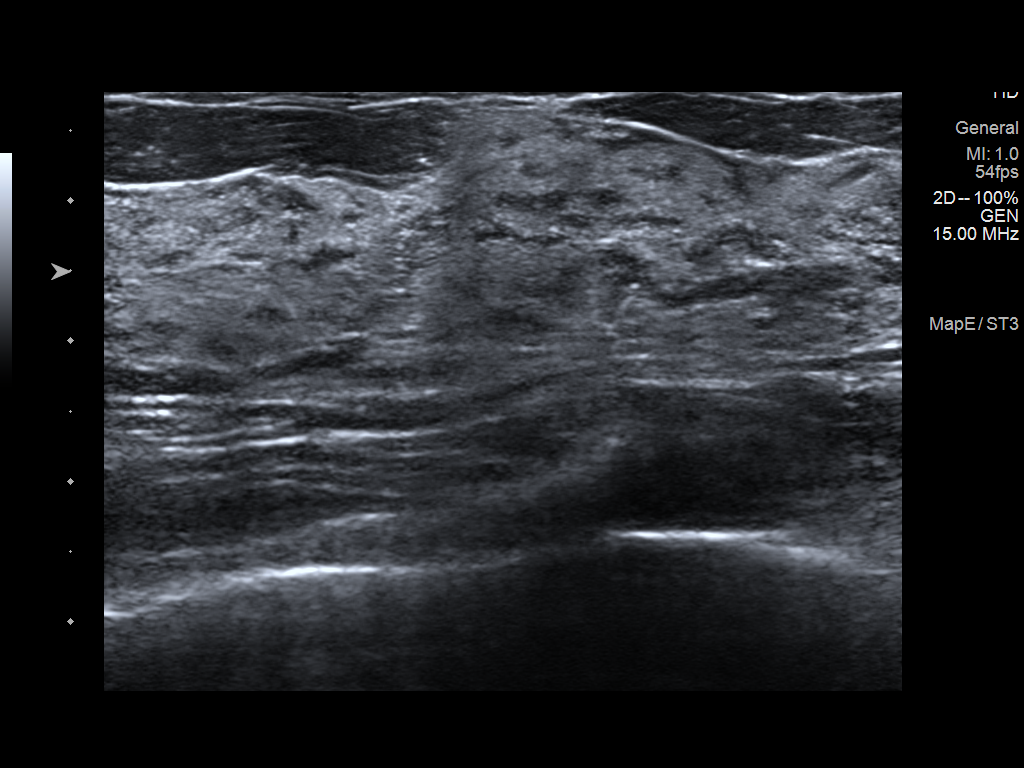
[im 4/4]
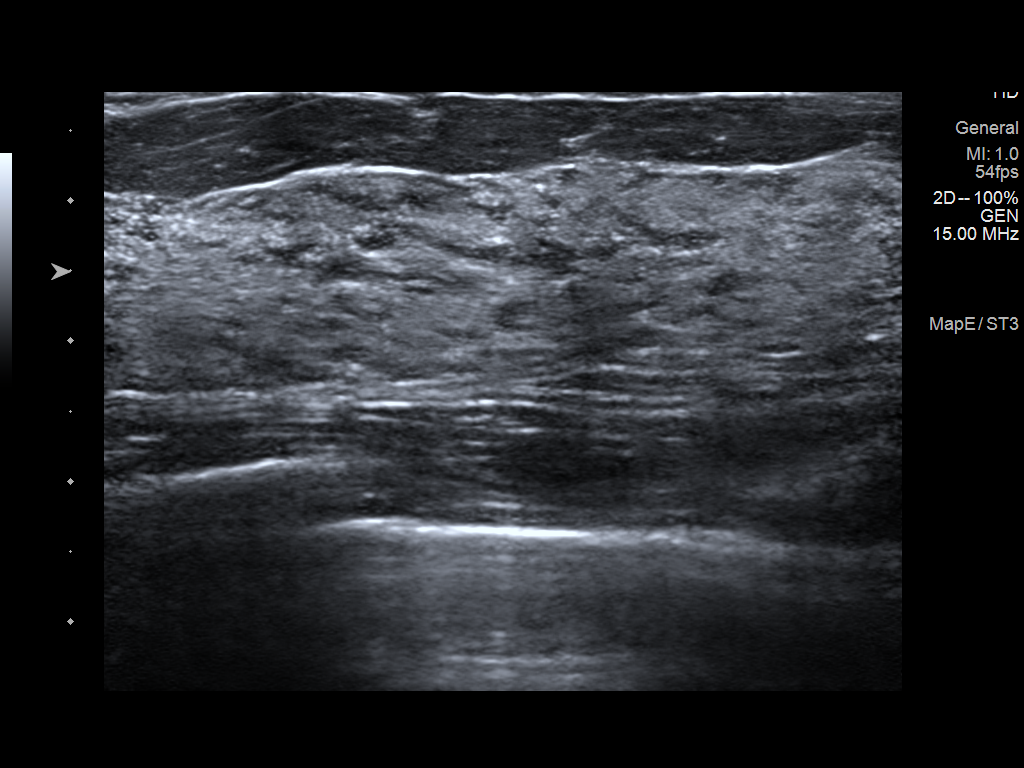

[4 of 4 positions shown; findings below may reference images not displayed]

FINDINGS: RIGHT breast: Targeted ultrasound is performed, evaluating the upper
RIGHT breast, with particular attention to the 11-12 o'clock axes as
directed by the patient, showing only normal fibroglandular tissues
and fat lobules throughout. There is a ridge of normal dense
fibroglandular tissue at the 12 o'clock axis, corresponding to the
area of clinical concern. No solid or cystic mass.

LEFT breast: Targeted ultrasound is performed, evaluating the upper
LEFT breast, with particular attention to the 2 o'clock axis as
directed by the patient, showing only normal fibroglandular tissues
and fat lobules throughout. There is a ridge of normal dense
fibroglandular tissue at the 2 o'clock axis, 8 cm from the nipple,
corresponding to the area of clinical concern. No solid or cystic
mass.
IMPRESSION: No evidence of malignancy within either breast.

RECOMMENDATION:
1. Screening mammogram at age 40 unless there are persistent or
intervening clinical concerns. (Code:63-M-YVG)
2. Benign causes of breast pain, and possible remedies, were
discussed with the patient. Patient was encouraged to follow-up with
referring physician if pain became localized and persistent or if a
palpable lump/mass developed.

I have discussed the findings and recommendations with the patient.
If applicable, a reminder letter will be sent to the patient
regarding the next appointment.

BI-RADS CATEGORY  1: Negative.

## 2021-10-31 ENCOUNTER — Ambulatory Visit (INDEPENDENT_AMBULATORY_CARE_PROVIDER_SITE_OTHER): Payer: Medicaid Other | Admitting: Obstetrics

## 2021-10-31 ENCOUNTER — Other Ambulatory Visit (HOSPITAL_COMMUNITY)
Admission: RE | Admit: 2021-10-31 | Discharge: 2021-10-31 | Disposition: A | Discharge status: Home / Self Care | Payer: Medicaid Other | Source: Ambulatory Visit | Attending: Obstetrics | Admitting: Obstetrics

## 2021-10-31 ENCOUNTER — Encounter: Payer: Self-pay | Admitting: Obstetrics

## 2021-10-31 ENCOUNTER — Other Ambulatory Visit: Payer: Self-pay

## 2021-10-31 VITALS — BP 139/94 | HR 88 | Ht 63.0 in | Wt 138.2 lb

## 2021-10-31 DIAGNOSIS — N9412 Deep dyspareunia: Secondary | ICD-10-CM | POA: Diagnosis not present

## 2021-10-31 DIAGNOSIS — R102 Pelvic and perineal pain: Secondary | ICD-10-CM | POA: Diagnosis not present

## 2021-10-31 DIAGNOSIS — N898 Other specified noninflammatory disorders of vagina: Secondary | ICD-10-CM | POA: Diagnosis not present

## 2021-10-31 DIAGNOSIS — Z113 Encounter for screening for infections with a predominantly sexual mode of transmission: Secondary | ICD-10-CM | POA: Diagnosis not present

## 2021-10-31 DIAGNOSIS — Z01419 Encounter for gynecological examination (general) (routine) without abnormal findings: Secondary | ICD-10-CM | POA: Diagnosis not present

## 2021-10-31 MED ORDER — IBUPROFEN 800 MG PO TABS: 800.0000 mg | ORAL_TABLET | Freq: Three times a day (TID) | ORAL | 5 refills | Status: AC | PRN

## 2021-10-31 NOTE — Progress Notes (Signed)
Patient presents for having pain with intercourse. She states that she recently became intimate with someone new. She states that she had not had intercourse for about 6 months prior to that. Pain lasted for about 2-3 days. She does have some vaginal discharge and odor. Denies irritation.  Last pap: 11/20 Normal

## 2021-10-31 NOTE — Progress Notes (Signed)
Subjective:        Beverly Myers is a 30 y.o. female here for a routine exam.  Current complaints: Pelvic pain and vaginal discharge.  The onset of the pelvic pain was with intercourse a week ago, and has continued with intercourse.  Personal health questionnaire:  Is patient Beverly Myers, have a family history of breast and/or ovarian cancer: yes Is there a family history of uterine cancer diagnosed at age < 20, gastrointestinal cancer, urinary tract cancer, family member who is a Personnel officer syndrome-associated carrier: no Is the patient overweight and hypertensive, family history of diabetes, personal history of gestational diabetes, preeclampsia or PCOS: no Is patient over 80, have PCOS,  family history of premature CHD under age 35, diabetes, smoke, have hypertension or peripheral artery disease:  no At any time, has a partner hit, kicked or otherwise hurt or frightened you?: no Over the past 2 weeks, have you felt down, depressed or hopeless?: no Over the past 2 weeks, have you felt little interest or pleasure in doing things?:no   Gynecologic History Patient's last menstrual period was 10/24/2021. Contraception: IUD Last Pap: 10-14-2019. Results were: normal Last mammogram: n/a. Results were: n/a  Obstetric History OB History  Gravida Para Term Preterm AB Living  1 1 1  0 0 1  SAB IAB Ectopic Multiple Live Births  0 0 0 0 1    # Outcome Date GA Lbr Len/2nd Weight Sex Delivery Anes PTL Lv  1 Term 06/09/12 [redacted]w[redacted]d 03:48 / 02:25 7 lb 14.1 oz (3.575 kg) M Vag-Spont EPI, Local  LIV    Past Medical History:  Diagnosis Date   Cellulitis 2012   in right foot   UTI (urinary tract infection)     Past Surgical History:  Procedure Laterality Date   DILATION AND CURETTAGE OF UTERUS  December 2016   TONSILECTOMY, ADENOIDECTOMY, BILATERAL MYRINGOTOMY AND TUBES       Current Outpatient Medications:    ibuprofen (ADVIL) 800 MG tablet, Take 1 tablet (800 mg total) by mouth every 8  (eight) hours as needed., Disp: 30 tablet, Rfl: 5   PARAGARD INTRAUTERINE COPPER IU, by Intrauterine route., Disp: , Rfl:  No Known Allergies  Social History   Tobacco Use   Smoking status: Former   Smokeless tobacco: Former    Quit date: 08/25/2015   Tobacco comments:    3-5 cigarretts per day  Substance Use Topics   Alcohol use: Yes    Comment: Ocassionally    Family History  Problem Relation Age of Onset   Cancer Paternal Grandmother       Review of Systems  Constitutional: negative for fatigue and weight loss Respiratory: negative for cough and wheezing Cardiovascular: negative for chest pain, fatigue and palpitations Gastrointestinal: negative for abdominal pain and change in bowel habits Musculoskeletal:negative for myalgias Neurological: negative for gait problems and tremors Behavioral/Psych: negative for abusive relationship, depression Endocrine: negative for temperature intolerance    Genitourinary: positive for vaginal discharge and pelvic pain.  negative for abnormal menstrual periods, genital lesions, hot flashes, sexual problems  Integument/breast: negative for breast lump, breast tenderness, nipple discharge and skin lesion(s)    Objective:       BP (!) 139/94   Pulse 88   Ht 5\' 3"  (1.6 m)   Wt 138 lb 3.2 oz (62.7 kg)   LMP 10/24/2021   BMI 24.48 kg/m  General:   Alert and no distress  Skin:   no rash or abnormalities  Lungs:  clear to auscultation bilaterally  Heart:   regular rate and rhythm, S1, S2 normal, no murmur, click, rub or gallop  Breasts:   normal without suspicious masses, skin or nipple changes or axillary nodes  Abdomen:  normal findings: no organomegaly, soft, non-tender and no hernia  Pelvis:  External genitalia: normal general appearance Urinary system: urethral meatus normal and bladder without fullness, nontender Vaginal: normal without tenderness, induration or masses Cervix: normal appearance Adnexa: left adnexal tenderness,   no masses Uterus: anteverted and non-tender, normal size   Lab Review Urine pregnancy test Labs reviewed yes Radiologic studies reviewed yes  I have spent a total of 20 minutes of face-to-face time, excluding clinical staff time, reviewing notes and preparing to see patient, ordering tests and/or medications, and counseling the patient.   Assessment:    1. Encounter for gynecological examination with Papanicolaou smear of cervix Rx: - Cytology - PAP( [facility])  2. Vaginal discharge Rx: - Cervicovaginal ancillary only( [facility])  3. Screening for STD (sexually transmitted disease) Rx: - Hepatitis B surface antigen - Hepatitis C antibody - HIV Antibody (routine testing w rflx) - [facility]  4. Pelvic pain Rx - US PELVIC COMPLETE WITH TRANSVAGINAL; Future  5. Deep dyspareunia in female      Plan:    Education reviewed: calcium supplements, depression evaluation, low fat, low cholesterol diet, safe sex/STD prevention, self breast exams, and weight bearing exercise. Contraception: IUD. Follow up in: 2 weeks. Pelvic U/S ordered for pelvic pain     Meds ordered this encounter  Medications   ibuprofen (ADVIL) 800 MG tablet    Sig: Take 1 tablet (800 mg total) by mouth every 8 (eight) hours as needed.    Dispense:  30 tablet    Refill:  5   Orders Placed This Encounter  Procedures   US PELVIC COMPLETE WITH TRANSVAGINAL    Standing Status:   Future    Standing Expiration Date:   10/31/2022    Order Specific Question:   Reason for Exam (SYMPTOM  OR DIAGNOSIS REQUIRED)    Answer:   Pelvic pain - left adnexal tenderness on exam    Order Specific Question:   Preferred imaging location?    Answer:   [facility] Ul[facility]ound   Hepatitis B surface antigen   Hepatitis C antibody   HIV Antibody (routine testing w rflx)   [facility]     Brock Bad, MD 10/31/2021 10:45 AM

## 2021-11-01 LAB — CERVICOVAGINAL ANCILLARY ONLY
Bacterial Vaginitis (gardnerella): POSITIVE — AB
Candida Glabrata: NEGATIVE
Candida Vaginitis: NEGATIVE
Chlamydia: NEGATIVE
Comment: NEGATIVE
Comment: NEGATIVE
Comment: NEGATIVE
Comment: NEGATIVE
Comment: NEGATIVE
Comment: NORMAL
Neisseria Gonorrhea: NEGATIVE
Trichomonas: NEGATIVE

## 2021-11-01 LAB — HEPATITIS C ANTIBODY: Hep C Virus Ab: <0.1 s/co ratio (ref 0.0–0.9)

## 2021-11-01 LAB — HIV ANTIBODY (ROUTINE TESTING W REFLEX): HIV Screen 4th Generation wRfx: NONREACTIVE

## 2021-11-01 LAB — SYPHILIS: RPR W/REFLEX TO RPR TITER AND TREPONEMAL ANTIBODIES, TRADITIONAL SCREENING AND DIAGNOSIS ALGORITHM: RPR Ser Ql: NONREACTIVE

## 2021-11-01 LAB — HEPATITIS B SURFACE ANTIGEN: Hepatitis B Surface Ag: NEGATIVE

## 2021-11-03 ENCOUNTER — Other Ambulatory Visit: Payer: Self-pay | Admitting: Obstetrics

## 2021-11-03 DIAGNOSIS — N760 Acute vaginitis: Secondary | ICD-10-CM

## 2021-11-03 DIAGNOSIS — B9689 Other specified bacterial agents as the cause of diseases classified elsewhere: Secondary | ICD-10-CM

## 2021-11-03 MED ORDER — METRONIDAZOLE 500 MG PO TABS: 500.0000 mg | ORAL_TABLET | Freq: Two times a day (BID) | ORAL | 2 refills | Status: DC

## 2021-11-05 LAB — CYTOLOGY - PAP
Comment: NEGATIVE
Diagnosis: UNDETERMINED — AB
High risk HPV: NEGATIVE

## 2021-11-06 ENCOUNTER — Ambulatory Visit
Admission: RE | Admit: 2021-11-06 | Discharge: 2021-11-06 | Disposition: A | Discharge status: Home / Self Care | Payer: Medicaid Other | Source: Ambulatory Visit | Attending: Obstetrics | Admitting: Obstetrics

## 2021-11-06 ENCOUNTER — Other Ambulatory Visit: Payer: Self-pay

## 2021-11-06 DIAGNOSIS — R102 Pelvic and perineal pain: Secondary | ICD-10-CM | POA: Insufficient documentation

## 2022-02-16 ENCOUNTER — Encounter: Payer: Self-pay | Admitting: Obstetrics

## 2022-02-17 ENCOUNTER — Other Ambulatory Visit: Payer: Self-pay

## 2022-02-17 DIAGNOSIS — B9689 Other specified bacterial agents as the cause of diseases classified elsewhere: Secondary | ICD-10-CM

## 2022-02-17 MED ORDER — METRONIDAZOLE 500 MG PO TABS: 500.0000 mg | ORAL_TABLET | Freq: Two times a day (BID) | ORAL | 2 refills | Status: AC

## 2022-02-17 NOTE — Progress Notes (Signed)
Patient sent a Mychart message requesting a prescription for BV. Annual/pap is UTD. Flagyl sent to the pharmacy per protocol.  ?

## 2022-02-25 ENCOUNTER — Other Ambulatory Visit: Payer: Self-pay | Admitting: *Deleted

## 2022-02-25 DIAGNOSIS — B3731 Acute candidiasis of vulva and vagina: Secondary | ICD-10-CM

## 2022-02-25 MED ORDER — FLUCONAZOLE 150 MG PO TABS: 150.0000 mg | ORAL_TABLET | Freq: Once | ORAL | 0 refills | Status: AC

## 2022-02-25 NOTE — Progress Notes (Signed)
Pt message regarding symptoms of yeast infection following BV treatment with Flagyl. RX Diflucan per protocol. ?

## 2022-05-01 ENCOUNTER — Emergency Department (HOSPITAL_COMMUNITY)
Admission: EM | Admit: 2022-05-01 | Discharge: 2022-05-02 | Disposition: A | Discharge status: Home / Self Care | Payer: Medicaid Other | Attending: Emergency Medicine | Admitting: Emergency Medicine

## 2022-05-01 DIAGNOSIS — W19XXXA Unspecified fall, initial encounter: Secondary | ICD-10-CM

## 2022-05-01 DIAGNOSIS — W1839XA Other fall on same level, initial encounter: Secondary | ICD-10-CM | POA: Diagnosis not present

## 2022-05-01 DIAGNOSIS — Z23 Encounter for immunization: Secondary | ICD-10-CM | POA: Insufficient documentation

## 2022-05-01 DIAGNOSIS — S0181XA Laceration without foreign body of other part of head, initial encounter: Secondary | ICD-10-CM | POA: Diagnosis not present

## 2022-05-01 DIAGNOSIS — R4020 Unspecified coma: Secondary | ICD-10-CM | POA: Diagnosis not present

## 2022-05-01 DIAGNOSIS — M549 Dorsalgia, unspecified: Secondary | ICD-10-CM | POA: Diagnosis not present

## 2022-05-01 DIAGNOSIS — I10 Essential (primary) hypertension: Secondary | ICD-10-CM | POA: Diagnosis not present

## 2022-05-01 DIAGNOSIS — R110 Nausea: Secondary | ICD-10-CM | POA: Diagnosis not present

## 2022-05-01 DIAGNOSIS — R58 Hemorrhage, not elsewhere classified: Secondary | ICD-10-CM | POA: Diagnosis not present

## 2022-05-01 DIAGNOSIS — S0993XA Unspecified injury of face, initial encounter: Secondary | ICD-10-CM | POA: Diagnosis present

## 2022-05-01 DIAGNOSIS — S0083XA Contusion of other part of head, initial encounter: Secondary | ICD-10-CM | POA: Diagnosis not present

## 2022-05-01 MED ORDER — TETANUS-DIPHTH-ACELL PERTUSSIS 5-2.5-18.5 LF-MCG/0.5 IM SUSY
0.5000 mL | PREFILLED_SYRINGE | Freq: Once | INTRAMUSCULAR | Status: AC
  Administered 2022-05-01: 0.5 mL via INTRAMUSCULAR
  Filled 2022-05-01: qty 0.5

## 2022-05-01 MED ORDER — LIDOCAINE-EPINEPHRINE-TETRACAINE (LET) TOPICAL GEL
3.0000 mL | Freq: Once | TOPICAL | Status: AC
  Administered 2022-05-01: 3 mL via TOPICAL
  Filled 2022-05-01: qty 3

## 2022-05-01 MED ORDER — LIDOCAINE HCL (PF) 1 % IJ SOLN
10.0000 mL | Freq: Once | INTRAMUSCULAR | Status: AC
  Administered 2022-05-02: 10 mL
  Filled 2022-05-01: qty 10

## 2022-05-01 NOTE — ED Triage Notes (Signed)
Pt bib [facility] from hom for unwitnessed fall. Pt son herd pt fall and called EMS. Pt is A&Ox3, does not remember the fall. Pt has lac to chin and busted lip. Pt c/o of back pain. Unknown amount of drugs or alcohol onboard.  EMS vitals 169/115 BP 114 HR 99% O2 RA 125 CBG \\16  RR

## 2022-05-01 NOTE — ED Notes (Signed)
Lidocaine, suture cart, and suture tray at bedside.

## 2022-05-01 NOTE — ED Provider Notes (Signed)
[facility] EMERGENCY DEPARTMENT Provider Note   CSN: [id number] Arrival date & time: 05/01/22  2216     History  Chief Complaint  Patient presents with   Beverly Myers is a 31 y.o. female.  31 year old female who does appear to be intoxicated on exam presents to the emergency room for evaluation of fall.  Sister is at bedside.  Patient does report having some drinks prior to the incident.  States she fell on a concrete floor.  Sister confirms the story.  Denies any complaints and is eager to be discharged.  Has laceration of chin.  The history is provided by the patient. No language interpreter was used.       Home Medications Prior to Admission medications   Medication Sig Start Date End Date Taking? Authorizing Provider  ibuprofen (ADVIL) 800 MG tablet Take 1 tablet (800 mg total) by mouth every 8 (eight) hours as needed. 10/31/21   Brock Bad, MD  metroNIDAZOLE (FLAGYL) 500 MG tablet Take 1 tablet (500 mg total) by mouth 2 (two) times daily. 02/17/22   Constant, Peggy, MD  PARAGARD INTRAUTERINE COPPER IU by Intrauterine route.    [provider]      Allergies    Patient has no known allergies.    Review of Systems   Review of Systems  Skin:  Positive for wound.  All other systems reviewed and are negative.   Physical Exam Updated Vital Signs BP (!) 141/112 (BP Location: Left Arm)   Pulse (!) 115   Temp 98.6 F (37 C) (Oral)   Resp 16   SpO2 100%  Physical Exam Vitals and nursing note reviewed.  Constitutional:      General: She is not in acute distress.    Appearance: Normal appearance. She is not ill-appearing.  HENT:     Head: Normocephalic and atraumatic.     Nose: Nose normal.  Eyes:     Conjunctiva/sclera: Conjunctivae normal.  Pulmonary:     Effort: Pulmonary effort is normal. No respiratory distress.  Musculoskeletal:        General: No deformity.     Cervical back: Normal range of motion. No  tenderness.  Skin:    Findings: No rash.     Comments: 1 cm laceration to chin noted.  Neurological:     Mental Status: She is alert.     ED Results / Procedures / Treatments   Labs (all labs ordered are listed, but only abnormal results are displayed) Labs Reviewed - No data to display  EKG None  Radiology No results found.  Procedures .Marland KitchenLaceration Repair  Date/Time: 05/02/2022 12:30 AM  Performed by: Marita Kansas, PA-C Authorized by: Marita Kansas, PA-C   Consent:    Consent obtained:  Verbal   Consent given by:  Patient   Risks discussed:  Infection, need for additional repair, pain, poor cosmetic result and poor wound healing   Alternatives discussed:  No treatment and delayed treatment Universal protocol:    Procedure explained and questions answered to patient or proxy's satisfaction: yes     Relevant documents present and verified: yes     Test results available: yes     Patient identity confirmed:  Verbally with patient Anesthesia:    Anesthesia method:  Local infiltration   Local anesthetic:  Lidocaine 1% w/o epi Laceration details:    Location:  Face   Face location:  Chin   Length (cm):  1 Treatment:  Area cleansed with:  Povidone-iodine and saline   Amount of cleaning:  Standard   Debridement:  None   Undermining:  None Skin repair:    Repair method:  Sutures   Suture size:  5-0   Suture material:  Prolene   Suture technique:  Simple interrupted   Number of sutures:  2 Approximation:    Approximation:  Close Repair type:    Repair type:  Simple Post-procedure details:    Dressing:  Open (no dressing)   Procedure completion:  Tolerated well, no immediate complications     Medications Ordered in ED Medications  Tdap (BOOSTRIX) injection 0.5 mL (has no administration in time range)  lidocaine (PF) (XYLOCAINE) 1 % injection 10 mL (has no administration in time range)  lidocaine-EPINEPHrine-tetracaine (LET) topical gel (3 mLs Topical Given 05/01/22  2232)    ED Course/ Medical Decision Making/ A&P                           Medical Decision Making Risk Prescription drug management.   31 year old female presents today for evaluation of fall and laceration to chin.  Does appear intoxicated on exam.  Is alert and oriented and answers all questions appropriately.  Sister is at bedside.  Denies loss of consciousness.  Denies other injuries.  Cervical spine without tenderness to palpation.  Laceration repaired with 2 sutures.  Return precautions discussed.  Patient is appropriate for discharge.  Discharged in stable condition with her sister who will give her a ride home.  Final Clinical Impression(s) / ED Diagnoses Final diagnoses:  Fall, initial encounter  Contusion of face, initial encounter    Rx / DC Orders ED Discharge Orders     None         Marita Kansas, PA-C 05/02/22 0032    Gwyneth Sprout, MD 05/02/22 1621

## 2022-05-02 NOTE — Discharge Instructions (Signed)
Your exam today was overall reassuring.  You had a laceration to your chin.  This was repaired.  Sutures will need to be removed in about 7 days.  This can be done by your primary care or an urgent care.  Or you can return to the emergency room to have this done.  Return for evaluation if you notice significant swelling, redness, drainage, or fever.

## 2022-05-02 NOTE — ED Notes (Signed)
Patient verbalizes understanding of d/c instructions. Opportunities for questions and answers were provided. Pt d/c from ED and ambulated to lobby with sister.  

## 2022-05-05 ENCOUNTER — Telehealth: Payer: Self-pay

## 2022-05-05 NOTE — Telephone Encounter (Signed)
Transition Care Management Follow-up Telephone Call Date of discharge and from where: 05/02/2022 from [facility] How have you been since you were released from the hospital? Patient stated that she is some pain and but did not have any questions or concerns at this time.  Any questions or concerns? No  Items Reviewed: Did the pt receive and understand the discharge instructions provided? Yes  Medications obtained and verified? Yes  Other? No  Any new allergies since your discharge? No  Dietary orders reviewed? No Do you have support at home? Yes   Functional Questionnaire: (I = Independent and D = Dependent) ADLs: I  Bathing/Dressing- I  Meal Prep- I  Eating- I  Maintaining continence- I  Transferring/Ambulation- I  Managing Meds- I   Follow up appointments reviewed:  PCP Hospital f/u appt confirmed? Yes  Scheduled to see [facility] on Thursday [facility] f/u appt confirmed? No   Are transportation arrangements needed? No  If their condition worsens, is the pt aware to call PCP or go to the Emergency Dept.? Yes Was the patient provided with contact information for the PCP's office or ED? Yes Was to pt encouraged to call back with questions or concerns? Yes

## 2022-05-08 DIAGNOSIS — G44309 Post-traumatic headache, unspecified, not intractable: Secondary | ICD-10-CM | POA: Diagnosis not present

## 2022-05-08 DIAGNOSIS — S069X9A Unspecified intracranial injury with loss of consciousness of unspecified duration, initial encounter: Secondary | ICD-10-CM | POA: Diagnosis not present

## 2022-05-08 DIAGNOSIS — S0181XA Laceration without foreign body of other part of head, initial encounter: Secondary | ICD-10-CM | POA: Diagnosis not present

## 2022-05-08 DIAGNOSIS — S0990XA Unspecified injury of head, initial encounter: Secondary | ICD-10-CM | POA: Diagnosis not present

## 2022-05-08 DIAGNOSIS — F331 Major depressive disorder, recurrent, moderate: Secondary | ICD-10-CM | POA: Diagnosis not present

## 2022-05-08 DIAGNOSIS — R030 Elevated blood-pressure reading, without diagnosis of hypertension: Secondary | ICD-10-CM | POA: Diagnosis not present

## 2022-05-08 DIAGNOSIS — Z09 Encounter for follow-up examination after completed treatment for conditions other than malignant neoplasm: Secondary | ICD-10-CM | POA: Diagnosis not present

## 2022-05-22 DIAGNOSIS — S0181XD Laceration without foreign body of other part of head, subsequent encounter: Secondary | ICD-10-CM | POA: Diagnosis not present

## 2022-05-22 DIAGNOSIS — Z4802 Encounter for removal of sutures: Secondary | ICD-10-CM | POA: Diagnosis not present

## 2022-05-22 DIAGNOSIS — Z789 Other specified health status: Secondary | ICD-10-CM | POA: Diagnosis not present

## 2022-06-18 ENCOUNTER — Encounter: Payer: Self-pay | Admitting: Obstetrics

## 2022-06-19 DIAGNOSIS — N3000 Acute cystitis without hematuria: Secondary | ICD-10-CM | POA: Diagnosis not present

## 2022-06-19 DIAGNOSIS — B3731 Acute candidiasis of vulva and vagina: Secondary | ICD-10-CM | POA: Diagnosis not present

## 2022-06-19 DIAGNOSIS — R350 Frequency of micturition: Secondary | ICD-10-CM | POA: Diagnosis not present

## 2022-06-19 DIAGNOSIS — R300 Dysuria: Secondary | ICD-10-CM | POA: Diagnosis not present

## 2022-09-23 ENCOUNTER — Encounter: Payer: Self-pay | Admitting: Obstetrics

## 2022-09-23 ENCOUNTER — Other Ambulatory Visit (HOSPITAL_COMMUNITY)
Admission: RE | Admit: 2022-09-23 | Discharge: 2022-09-23 | Disposition: A | Discharge status: Home / Self Care | Payer: Medicaid Other | Source: Ambulatory Visit | Attending: Obstetrics | Admitting: Obstetrics

## 2022-09-23 ENCOUNTER — Ambulatory Visit (INDEPENDENT_AMBULATORY_CARE_PROVIDER_SITE_OTHER): Payer: Medicaid Other | Admitting: Obstetrics

## 2022-09-23 VITALS — BP 139/99 | HR 86 | Ht 63.0 in | Wt 132.0 lb

## 2022-09-23 DIAGNOSIS — N898 Other specified noninflammatory disorders of vagina: Secondary | ICD-10-CM

## 2022-09-23 DIAGNOSIS — Z113 Encounter for screening for infections with a predominantly sexual mode of transmission: Secondary | ICD-10-CM | POA: Diagnosis not present

## 2022-09-23 MED ORDER — METRONIDAZOLE 500 MG PO TABS: 500.0000 mg | ORAL_TABLET | Freq: Two times a day (BID) | ORAL | 2 refills | Status: AC

## 2022-09-23 MED ORDER — FLUCONAZOLE 200 MG PO TABS: 200.0000 mg | ORAL_TABLET | ORAL | 2 refills | Status: AC

## 2022-09-23 NOTE — Progress Notes (Signed)
Patient ID: Beverly Myers, female   DOB: [birth year], 31 y.o.   MRN: [id number]  Chief Complaint  Patient presents with   Vaginal Discharge    HPI Beverly Myers is a 31 y.o. female.  Complains of vaginal discharge and irritation.  Denies pelvic pain or dysuria. HPI  Past Medical History:  Diagnosis Date   Cellulitis 2012   in right foot   UTI (urinary tract infection)     Past Surgical History:  Procedure Laterality Date   DILATION AND CURETTAGE OF UTERUS  December 2016   TONSILECTOMY, ADENOIDECTOMY, BILATERAL MYRINGOTOMY AND TUBES      Family History  Problem Relation Age of Onset   Cancer Paternal Grandmother     Social History Social History   Tobacco Use   Smoking status: Former   Smokeless tobacco: Current    Last attempt to quit: 08/25/2015   Tobacco comments:    3-5 cigarretts per day  Vaping Use   Vaping Use: Some days  Substance Use Topics   Alcohol use: Yes    Comment: Ocassionally   Drug use: No    No Known Allergies  Current Outpatient Medications  Medication Sig Dispense Refill   fluconazole (DIFLUCAN) 200 MG tablet Take 1 tablet (200 mg total) by mouth every 3 (three) days. 3 tablet 2   metroNIDAZOLE (FLAGYL) 500 MG tablet Take 1 tablet (500 mg total) by mouth 2 (two) times daily. 14 tablet 2   ibuprofen (ADVIL) 800 MG tablet Take 1 tablet (800 mg total) by mouth every 8 (eight) hours as needed. (Patient not taking: Reported on 09/23/2022) 30 tablet 5   metroNIDAZOLE (FLAGYL) 500 MG tablet Take 1 tablet (500 mg total) by mouth 2 (two) times daily. (Patient not taking: Reported on 09/23/2022) 14 tablet 2   PARAGARD INTRAUTERINE COPPER IU by Intrauterine route.     No current facility-administered medications for this visit.    Review of Systems Review of Systems Constitutional: negative for fatigue and weight loss Respiratory: negative for cough and wheezing Cardiovascular: negative for chest pain, fatigue and palpitations Gastrointestinal:  negative for abdominal pain and change in bowel habits Genitourinary:positive for vaginal discharge and irritation Integument/breast: negative for nipple discharge Musculoskeletal:negative for myalgias Neurological: negative for gait problems and tremors Behavioral/Psych: negative for abusive relationship, depression Endocrine: negative for temperature intolerance      Blood pressure (!) 139/99, pulse 86, height 5\' 3"  (1.6 m), weight 132 lb (59.9 kg), last menstrual period 08/28/2022.  Physical Exam Physical Exam General:   Alert and no distress  Skin:   no rash or abnormalities  Lungs:   clear to auscultation bilaterally  Heart:   regular rate and rhythm, S1, S2 normal, no murmur, click, rub or gallop  Breasts:   normal without suspicious masses, skin or nipple changes or axillary nodes  Abdomen:  normal findings: no organomegaly, soft, non-tender and no hernia  Pelvis:  External genitalia: normal general appearance Urinary system: urethral meatus normal and bladder without fullness, nontender Vaginal: normal without tenderness, induration or masses Cervix: normal appearance Adnexa: normal bimanual exam Uterus: anteverted and non-tender, normal size    I have spent a total of 20 minutes of face-to-face and non-face-to-face time, excluding clinical staff time, reviewing notes and preparing to see patient, ordering tests and/or medications, and counseling the patient.   Data Reviewed Labs  Assessment     1. Vaginal discharge Rx: - Cervicovaginal ancillary only( [place]) - fluconazole (DIFLUCAN) 200 MG tablet; Take 1 tablet (200  mg total) by mouth every 3 (three) days.  Dispense: 3 tablet; Refill: 2 - metroNIDAZOLE (FLAGYL) 500 MG tablet; Take 1 tablet (500 mg total) by mouth 2 (two) times daily.  Dispense: 14 tablet; Refill: 2  2. Vaginal irritation - probable yeast infection  3. Screening examination for sexually transmitted disease Rx: - RPR - HIV antibody (with  reflex) - Hepatitis C Antibody - Hepatitis B Surface AntiGEN     Plan Follow up prn    Brock Bad, MD 09/23/2022 12:45 PM

## 2022-09-23 NOTE — Progress Notes (Addendum)
31 y.o GYN presents for vaginal irritation, yellowish discharge, itching, painful intercourse x 5 days, sx started after sex with partner.  Last PAP 10/31/2021 [facility]

## 2022-09-24 ENCOUNTER — Other Ambulatory Visit: Payer: Self-pay | Admitting: Obstetrics

## 2022-09-24 LAB — HEPATITIS B SURFACE ANTIGEN: Hepatitis B Surface Ag: NEGATIVE

## 2022-09-24 LAB — CERVICOVAGINAL ANCILLARY ONLY
Bacterial Vaginitis (gardnerella): NEGATIVE
Candida Glabrata: NEGATIVE
Candida Vaginitis: POSITIVE — AB
Chlamydia: NEGATIVE
Comment: NEGATIVE
Comment: NEGATIVE
Comment: NEGATIVE
Comment: NEGATIVE
Comment: NEGATIVE
Comment: NORMAL
Neisseria Gonorrhea: NEGATIVE
Trichomonas: NEGATIVE

## 2022-09-24 LAB — HIV ANTIBODY (ROUTINE TESTING W REFLEX): HIV Screen 4th Generation wRfx: NONREACTIVE

## 2022-09-24 LAB — RPR: RPR Ser Ql: NONREACTIVE

## 2022-09-24 LAB — HEPATITIS C ANTIBODY: Hep C Virus Ab: NONREACTIVE

## 2022-10-01 DIAGNOSIS — Z130 Encounter for screening for diseases of the blood and blood-forming organs and certain disorders involving the immune mechanism: Secondary | ICD-10-CM | POA: Diagnosis not present

## 2022-10-01 DIAGNOSIS — F411 Generalized anxiety disorder: Secondary | ICD-10-CM | POA: Diagnosis not present

## 2022-10-01 DIAGNOSIS — I10 Essential (primary) hypertension: Secondary | ICD-10-CM | POA: Diagnosis not present

## 2022-10-01 DIAGNOSIS — Z13220 Encounter for screening for lipoid disorders: Secondary | ICD-10-CM | POA: Diagnosis not present

## 2022-10-07 DIAGNOSIS — F411 Generalized anxiety disorder: Secondary | ICD-10-CM | POA: Diagnosis not present

## 2022-10-07 DIAGNOSIS — I10 Essential (primary) hypertension: Secondary | ICD-10-CM | POA: Diagnosis not present

## 2022-10-07 DIAGNOSIS — Z6823 Body mass index (BMI) 23.0-23.9, adult: Secondary | ICD-10-CM | POA: Diagnosis not present

## 2022-10-07 DIAGNOSIS — S0032XA Blister (nonthermal) of nose, initial encounter: Secondary | ICD-10-CM | POA: Diagnosis not present

## 2022-10-07 DIAGNOSIS — E781 Pure hyperglyceridemia: Secondary | ICD-10-CM | POA: Diagnosis not present

## 2022-10-07 DIAGNOSIS — Z0000 Encounter for general adult medical examination without abnormal findings: Secondary | ICD-10-CM | POA: Diagnosis not present

## 2022-10-19 DIAGNOSIS — N390 Urinary tract infection, site not specified: Secondary | ICD-10-CM | POA: Diagnosis not present

## 2023-04-08 ENCOUNTER — Encounter: Payer: Self-pay | Admitting: Obstetrics

## 2023-04-08 DIAGNOSIS — R300 Dysuria: Secondary | ICD-10-CM | POA: Diagnosis not present

## 2023-04-08 DIAGNOSIS — N3000 Acute cystitis without hematuria: Secondary | ICD-10-CM | POA: Diagnosis not present

## 2023-04-08 DIAGNOSIS — R350 Frequency of micturition: Secondary | ICD-10-CM | POA: Diagnosis not present

## 2023-06-14 IMAGING — US US PELVIS COMPLETE WITH TRANSVAGINAL
1 series · 15 of 25 positions shown · non-contrast
Comparison: 08/21/2020

CLINICAL DATA: Pelvic pain, left adnexal pain



[Series 1: us pelvis complete with transvaginal · 15 of 96 slices shown]
[im 1/96]
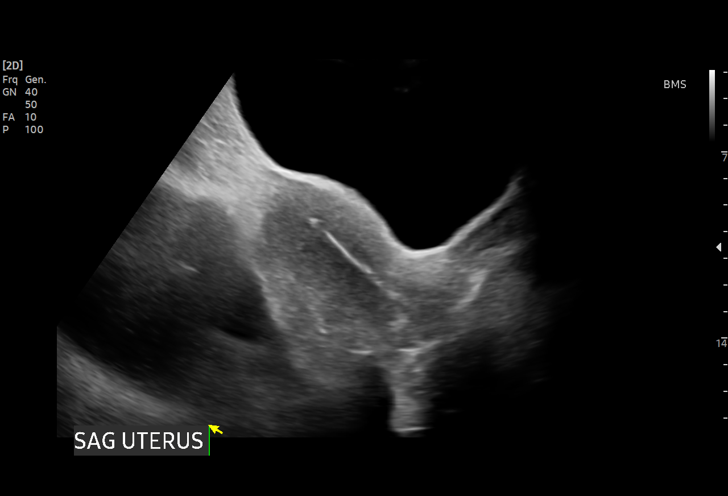
[im 8/96]
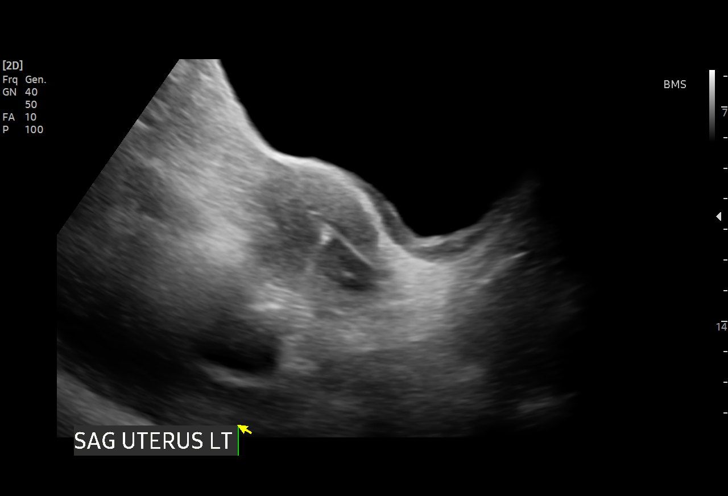
[im 16/96]
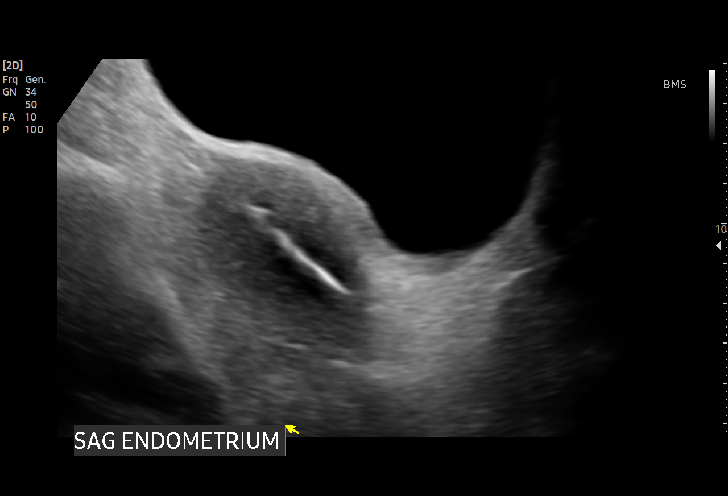
[im 20/96]
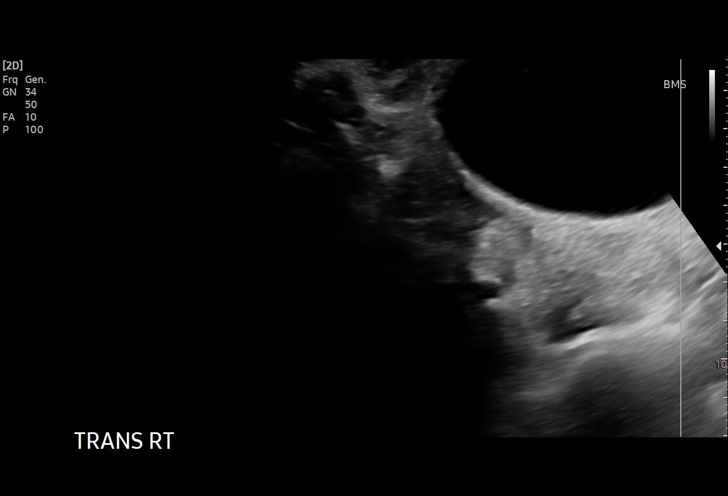
[im 28/96]
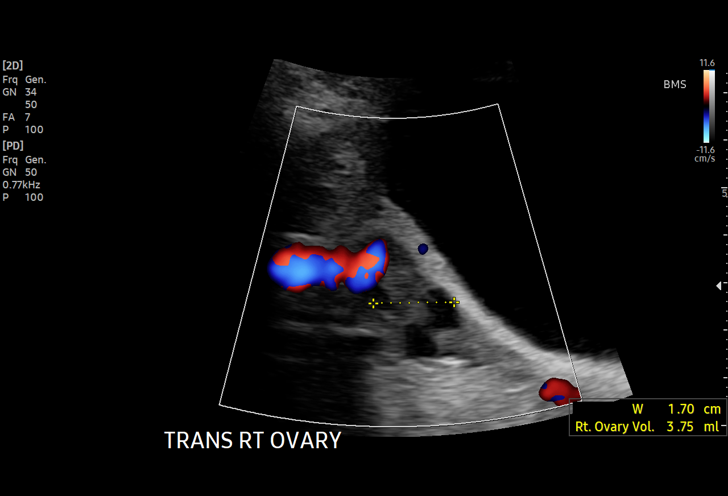
[im 36/96]
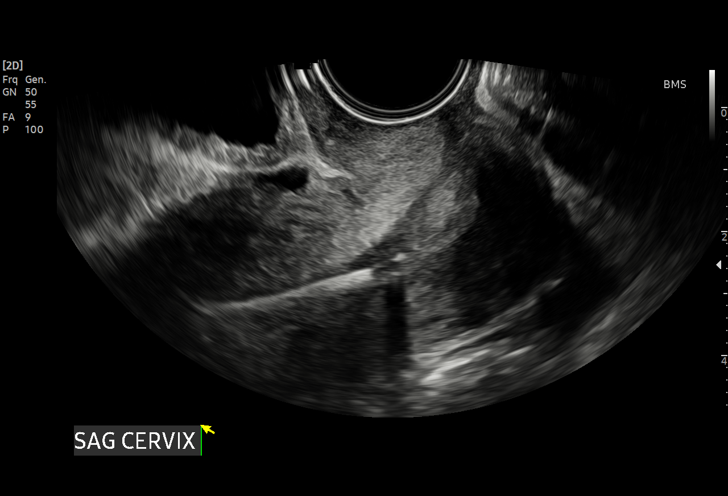
[im 40/96]
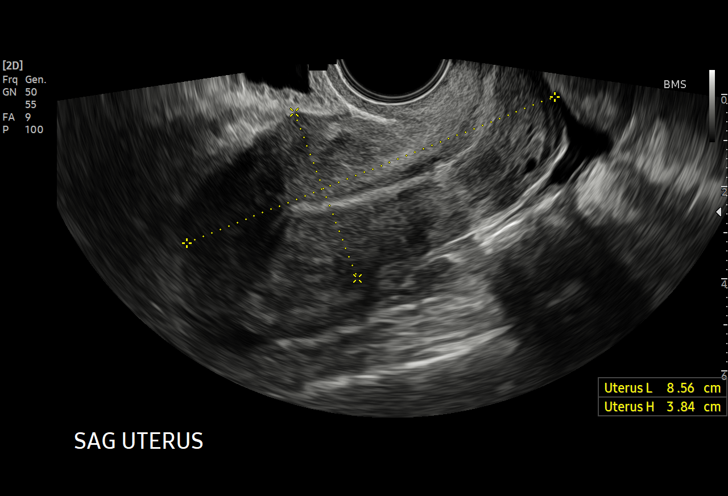
[im 48/96]
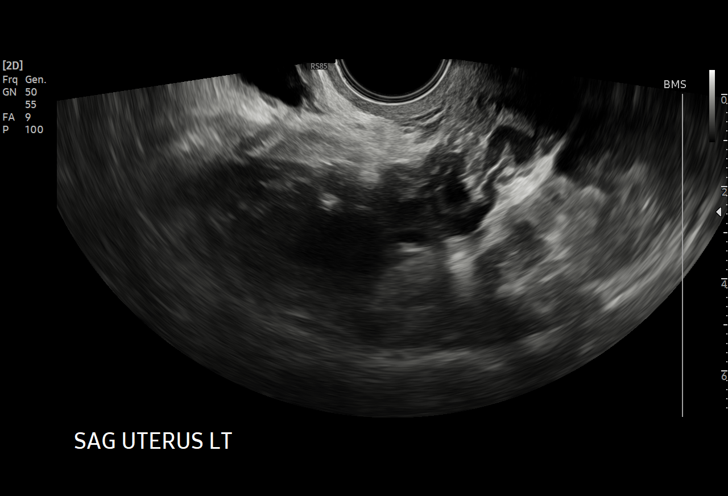
[im 56/96]
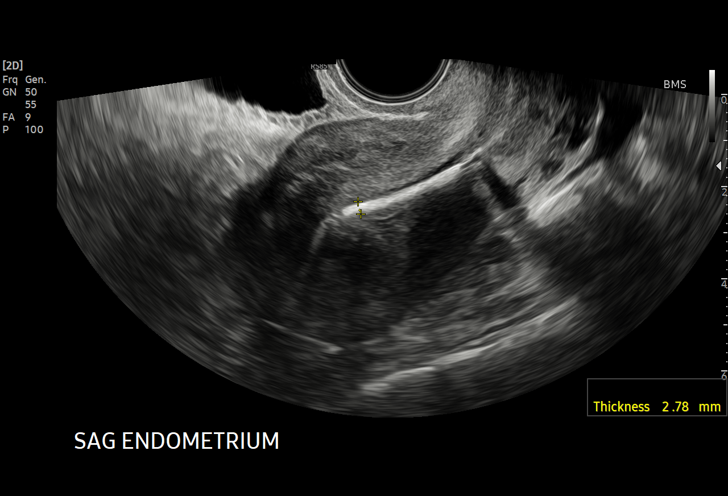
[im 60/96]
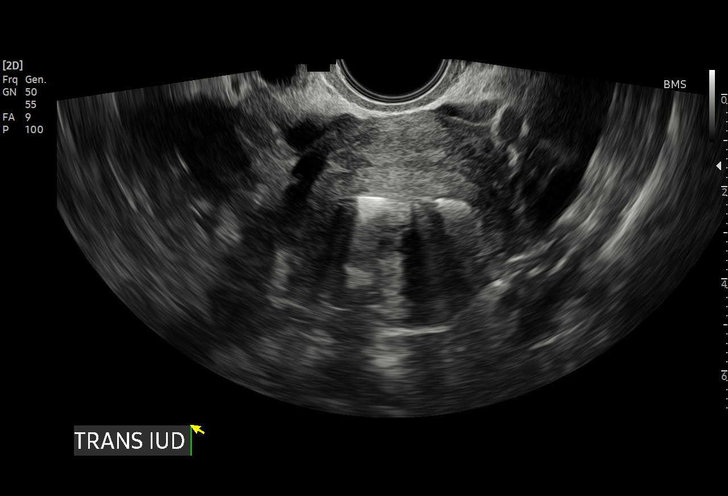
[im 68/96]
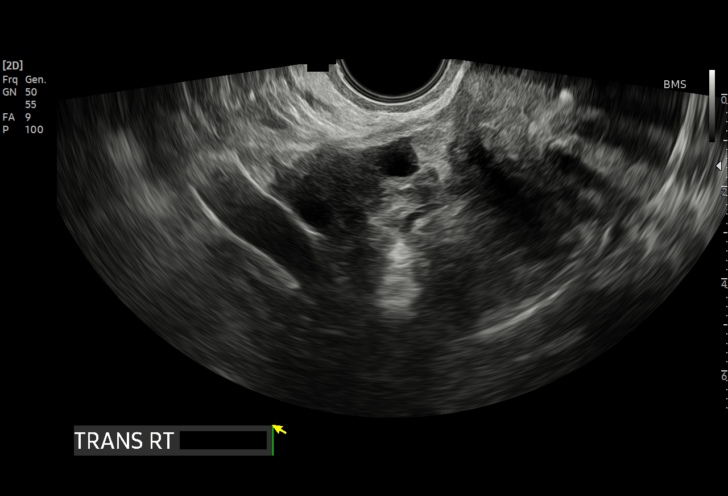
[im 76/96]
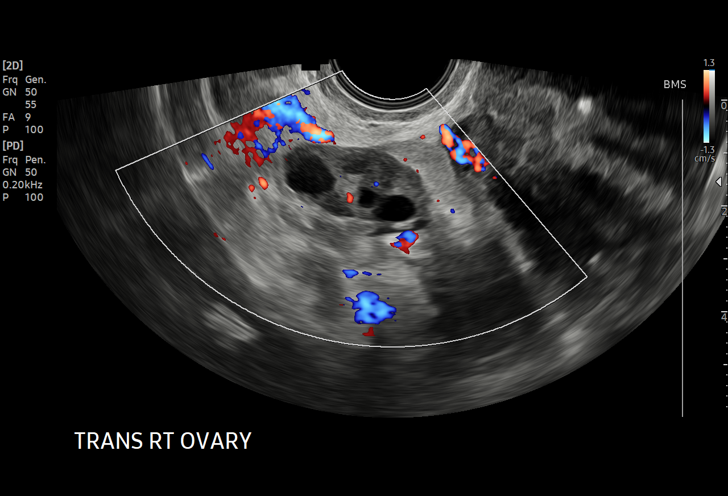
[im 80/96]
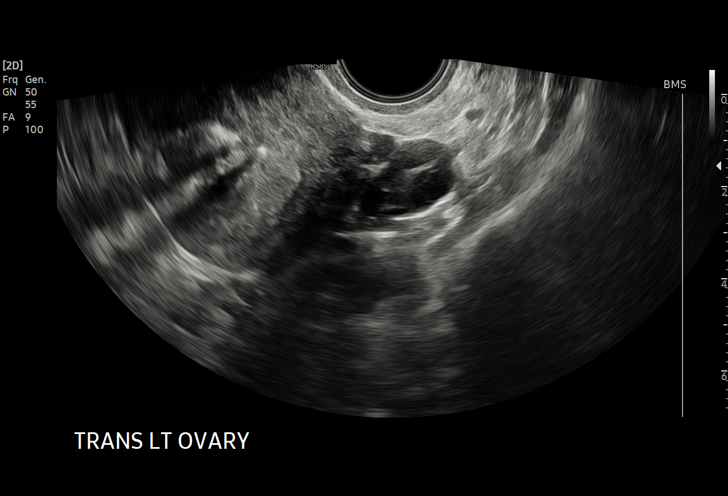
[im 88/96]
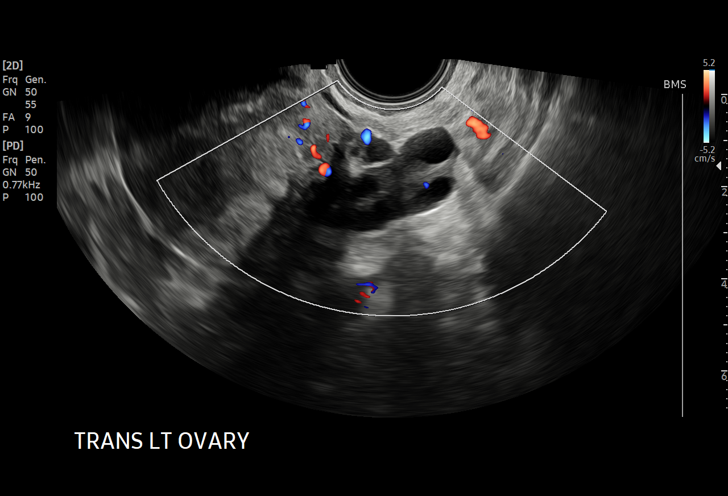
[im 96/96]
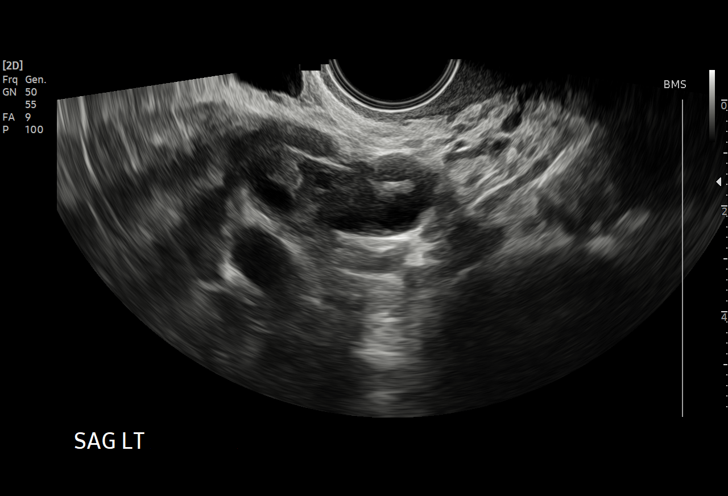

[15 of 25 positions shown; findings below may reference images not displayed]

FINDINGS: Uterus

Measurements: 8.4 x 3.7 x 5.0 cm = volume: 81 mL. No fibroids or
other mass visualized.

Endometrium

Thickness: 3 mm in thickness. IUD in place. No focal abnormality
visualized.

Right ovary

Measurements: 3.4 x 2.0 x 3.2 cm = volume: 11.5 mL. Normal
appearance/no adnexal mass.

Left ovary

Measurements: 3.5 x 1.8 x 2.0 cm = volume: 6 point set mL. Normal
appearance/no adnexal mass.

Other findings

No abnormal free fluid.
IMPRESSION: Unremarkable pelvic ultrasound.

IUD in expected position.

## 2024-03-23 ENCOUNTER — Ambulatory Visit: Admitting: Obstetrics and Gynecology

## 2024-03-23 ENCOUNTER — Encounter: Payer: Self-pay | Admitting: Obstetrics and Gynecology

## 2024-03-23 ENCOUNTER — Other Ambulatory Visit (HOSPITAL_COMMUNITY)
Admission: RE | Admit: 2024-03-23 | Discharge: 2024-03-23 | Disposition: A | Discharge status: Home / Self Care | Source: Ambulatory Visit | Attending: Obstetrics and Gynecology | Admitting: Obstetrics and Gynecology

## 2024-03-23 VITALS — BP 133/90 | HR 70 | Wt 158.4 lb

## 2024-03-23 DIAGNOSIS — Z01419 Encounter for gynecological examination (general) (routine) without abnormal findings: Secondary | ICD-10-CM | POA: Diagnosis not present

## 2024-03-23 DIAGNOSIS — R635 Abnormal weight gain: Secondary | ICD-10-CM

## 2024-03-23 DIAGNOSIS — Z124 Encounter for screening for malignant neoplasm of cervix: Secondary | ICD-10-CM | POA: Diagnosis not present

## 2024-03-23 DIAGNOSIS — Z113 Encounter for screening for infections with a predominantly sexual mode of transmission: Secondary | ICD-10-CM | POA: Insufficient documentation

## 2024-03-23 DIAGNOSIS — Z1339 Encounter for screening examination for other mental health and behavioral disorders: Secondary | ICD-10-CM

## 2024-03-23 DIAGNOSIS — Z975 Presence of (intrauterine) contraceptive device: Secondary | ICD-10-CM

## 2024-03-23 NOTE — Progress Notes (Signed)
 IUD placed 03/13/2016.  Would like to discuss hormonal weight gain.

## 2024-03-23 NOTE — Progress Notes (Signed)
 ANNUAL EXAM Patient name: Ranyiah Laird MRN [id number]  Date of birth: 11-26-1990 Chief Complaint:   No chief complaint on file.  History of Present Illness:   Zoel Picou is a 33 y.o. G49P1001  female being seen today for a routine annual exam.  Current complaints:  Doing well with IUD in place. Reports odor and d/c after sex  Reports weight gain, has been doing dietary changes, and exercise    Patient's last menstrual period was 03/06/2024. 2017 insertion Paragard    The pregnancy intention screening data noted above was reviewed. Potential methods of contraception were discussed. The patient elected to proceed with IUD  Last pap 2022. Results were: ASCUS w/ HRHPV negative. H/O abnormal pap: yes Last mammogram: n/a. Results were: N/A. Family h/o breast cancer: yes Pgm Last colonoscopy: n/a. Results were: N/A. Family h/o colorectal cancer: no     03/23/2024    1:58 PM 12/15/2013    4:23 PM 10/26/2013    4:44 PM  Depression screen PHQ 2/9  Decreased Interest 0 1 3  Down, Depressed, Hopeless 0 1 2  PHQ - 2 Score 0 2 5  Altered sleeping 1 1 0  Tired, decreased energy 1 3 2   Change in appetite 1 3 3   Feeling bad or failure about yourself  0 3 3  Trouble concentrating 0 1 1  Moving slowly or fidgety/restless 0 0 0  Suicidal thoughts 0 0 0  PHQ-9 Score 3 13 14         03/23/2024    2:00 PM  GAD 7 : Generalized Anxiety Score  Nervous, Anxious, on Edge 1  Control/stop worrying 1  Worry too much - different things 1  Trouble relaxing 0  Restless 0  Easily annoyed or irritable 0  Afraid - awful might happen 0  Total GAD 7 Score 3  Anxiety Difficulty Not difficult at all     Review of Systems:   Pertinent items are noted in HPI Denies any headaches, blurred vision, fatigue, shortness of breath, chest pain, abdominal pain, abnormal vaginal discharge/itching/odor/irritation, problems with periods, bowel movements, urination, or intercourse unless otherwise stated  above. Pertinent History Reviewed:  Reviewed past medical,surgical, social and family history.  Reviewed problem list, medications and allergies. Physical Assessment:   Vitals:   03/23/24 1312  BP: (!) 133/90  Pulse: 70  Weight: 158 lb 6.4 oz (71.8 kg)  Body mass index is 28.06 kg/m.        Physical Examination:   General appearance - well appearing, and in no distress  Mental status - alert, oriented   Psych:  She has a normal mood and affect  Skin - warm and dry, normal color, no suspicious lesions noted  Chest - effort normal, all lung fields clear to auscultation bilaterally  Heart - normal rate and regular rhythm  Neck:  midline trachea, no thyromegaly   Breasts - breasts appear normal, no suspicious masses, no skin or nipple changes or  axillary nodes  Abdomen - soft, nontender, nondistended, no masses or organomegaly  Pelvic - VULVA: normal appearing vulva with no masses, tenderness or lesions  VAGINA: normal appearing vagina with normal color and discharge, no lesions  CERVIX: normal appearing cervix without discharge or lesions, no CM. IUD strings visualized   Thin prep pap is done w HR HPV cotesting  UTERUS: uterus is felt to be normal size, shape, consistency and nontender   ADNEXA: No adnexal masses or tenderness noted.   Extremities:  No swelling or  varicosities noted  Chaperone present for exam  No results found for this or any previous visit (from the past 24 hours).  Assessment & Plan:  1. Encounter for annual routine gynecological examination (Primary) Discussed measures discussed regarding d/c and odor after intercourse Encouraged self breast exams  Mammogram at 40  Follow up pap pending result  2. IUD (intrauterine device) in place Paragard  due for removal 2027 unless desires removal sooner  3. Cervical cancer screening Collected today   - Cytology - PAP( [place])  4. Weight gain  - TSH - HgB A1c - CBC - Comp Met ([facility])  5. Screen for STD  (sexually transmitted disease)  - Cervicovaginal ancillary only( [place]) - HIV antibody (with reflex) - Hepatitis B Surface AntiGEN - Hepatitis C Antibody - RPR  Labs/procedures today:   Mammogram: @ 33yo, or sooner if problems Colonoscopy: @ 33yo, or sooner if problems  Orders Placed This Encounter  Procedures   HIV antibody (with reflex)   Hepatitis B Surface AntiGEN   Hepatitis C Antibody   RPR   TSH   HgB A1c   CBC   Comp Met ([facility])    Meds: No orders of the defined types were placed in this encounter.   Follow-up: Return in about 1 year (around 03/23/2025) for Zoanne Hinders, FNP

## 2024-03-24 LAB — CERVICOVAGINAL ANCILLARY ONLY
Bacterial Vaginitis (gardnerella): NEGATIVE
Candida Glabrata: NEGATIVE
Candida Vaginitis: NEGATIVE
Chlamydia: NEGATIVE
Comment: NEGATIVE
Comment: NEGATIVE
Comment: NEGATIVE
Comment: NEGATIVE
Comment: NEGATIVE
Comment: NORMAL
Neisseria Gonorrhea: NEGATIVE
Trichomonas: NEGATIVE

## 2024-03-24 LAB — HIV ANTIBODY (ROUTINE TESTING W REFLEX): HIV Screen 4th Generation wRfx: NONREACTIVE

## 2024-03-24 LAB — CBC
Hematocrit: 36.2 % (ref 34.0–46.6)
Hemoglobin: 11.6 g/dL (ref 11.1–15.9)
MCH: 27.4 pg (ref 26.6–33.0)
MCHC: 32 g/dL (ref 31.5–35.7)
MCV: 85 fL (ref 79–97)
Platelets: 318 10*3/uL (ref 150–450)
RBC: 4.24 x10E6/uL (ref 3.77–5.28)
RDW: 14.7 % (ref 11.7–15.4)
WBC: 6 10*3/uL (ref 3.4–10.8)

## 2024-03-24 LAB — COMPREHENSIVE METABOLIC PANEL WITH GFR
ALT: 9 IU/L (ref 0–32)
AST: 14 IU/L (ref 0–40)
Albumin: 4.6 g/dL (ref 3.9–4.9)
Alkaline Phosphatase: 56 IU/L (ref 44–121)
BUN/Creatinine Ratio: 9 (ref 9–23)
BUN: 6 mg/dL (ref 6–20)
Bilirubin Total: 0.7 mg/dL (ref 0.0–1.2)
CO2: 20 mmol/L (ref 20–29)
Calcium: 9.6 mg/dL (ref 8.7–10.2)
Chloride: 101 mmol/L (ref 96–106)
Creatinine, Ser: 0.65 mg/dL (ref 0.57–1.00)
Globulin, Total: 2.6 g/dL (ref 1.5–4.5)
Glucose: 95 mg/dL (ref 70–99)
Potassium: 3.5 mmol/L (ref 3.5–5.2)
Sodium: 137 mmol/L (ref 134–144)
Total Protein: 7.2 g/dL (ref 6.0–8.5)
eGFR: 120 mL/min/{1.73_m2} (ref >59)

## 2024-03-24 LAB — TSH: TSH: 1.54 u[IU]/mL (ref 0.450–4.500)

## 2024-03-24 LAB — HEPATITIS C ANTIBODY: Hep C Virus Ab: NONREACTIVE

## 2024-03-24 LAB — HEMOGLOBIN A1C
Est. average glucose Bld gHb Est-mCnc: 97 mg/dL
Hgb A1c MFr Bld: 5 % (ref 4.8–5.6)

## 2024-03-24 LAB — HEPATITIS B SURFACE ANTIGEN: Hepatitis B Surface Ag: NEGATIVE

## 2024-03-24 LAB — RPR: RPR Ser Ql: NONREACTIVE

## 2024-03-28 LAB — CYTOLOGY - PAP
Comment: NEGATIVE
Diagnosis: NEGATIVE
High risk HPV: NEGATIVE

## 2024-06-10 ENCOUNTER — Ambulatory Visit: Admitting: Certified Nurse Midwife

## 2024-06-10 ENCOUNTER — Encounter: Payer: Self-pay | Admitting: Certified Nurse Midwife

## 2024-06-10 VITALS — BP 139/93 | HR 87 | Ht 63.0 in | Wt 143.0 lb

## 2024-06-10 DIAGNOSIS — Z3201 Encounter for pregnancy test, result positive: Secondary | ICD-10-CM

## 2024-06-10 DIAGNOSIS — Z3009 Encounter for other general counseling and advice on contraception: Secondary | ICD-10-CM

## 2024-06-10 DIAGNOSIS — Z975 Presence of (intrauterine) contraceptive device: Secondary | ICD-10-CM

## 2024-06-10 DIAGNOSIS — Z3202 Encounter for pregnancy test, result negative: Secondary | ICD-10-CM | POA: Diagnosis not present

## 2024-06-10 DIAGNOSIS — Z30432 Encounter for removal of intrauterine contraceptive device: Secondary | ICD-10-CM | POA: Diagnosis not present

## 2024-06-10 DIAGNOSIS — Z319 Encounter for procreative management, unspecified: Secondary | ICD-10-CM

## 2024-06-10 LAB — POCT URINE PREGNANCY: Preg Test, Ur: NEGATIVE

## 2024-06-10 NOTE — Progress Notes (Signed)
 Has copper  IUD, period was 5 days late, took 2 home pregnancy test both came back positive. Pt is currently spotting. Abd pain for approx 4 days; also experiencing nausea and vomiting. Pt states she hasn't been sleeping well and experiencing night sweats for 3-4 days.   Wishes to discuss having IUD removed today with provider; does not request any other birth control at this time.   Wishes to discuss what to expect if pregnant with the IUD

## 2024-06-11 NOTE — Progress Notes (Signed)
    GYNECOLOGY OFFICE PROCEDURE NOTE  Beverly Myers is a 33 y.o. G1P1001 here for IUD removal. Patient state that her periods was several days later and had 2 positive home pregnancy test that were positive last week, however has started bleeding as of yesterday like her period. She currently has a ParaGard  IUD in place. She states that recently she has been experiencing more lower abdominal discomfort an pain worse with intercourse. She also reports pain from her partner during intercourse with the strings being more uncomfortable. She is concerned that her IUD has migrated. It has been in place for the last 8 years.    UPT in the office today was negative. Offered patient the option to leave IUD in if not currently desiring pregnancy. Patient reports that she would still like it removed.  Last pap smear was on 03/23/2024 and was normal.   IUD Insertion Procedure Note Patient identified and agrees with removal. Speculum placed in the vagina. Bright red vaginal bleeding noted. Cleared with several fox swabs no clotting. Cervix visualized. White strings visualized and grasped with ring forceps. With gentle traction IUD expelled intact. Confirmatory suspicion for migration. Patient tolerated procedure well.   Patient was given post-procedure instructions.    Natthew Marlatt Erven) Emilio, MSN, CNM  [facility]  06/11/2024 4:31 PM

## 2024-06-13 ENCOUNTER — Encounter

## 2024-07-04 ENCOUNTER — Encounter

## 2024-08-04 ENCOUNTER — Other Ambulatory Visit: Payer: Self-pay | Admitting: *Deleted

## 2024-08-04 ENCOUNTER — Other Ambulatory Visit: Payer: Self-pay

## 2024-08-04 ENCOUNTER — Encounter: Payer: Self-pay | Admitting: Certified Nurse Midwife

## 2024-08-04 MED ORDER — FLUCONAZOLE 150 MG PO TABS: 150.0000 mg | ORAL_TABLET | Freq: Once | ORAL | 0 refills | Status: DC

## 2024-08-04 MED ORDER — FLUCONAZOLE 150 MG PO TABS: 150.0000 mg | ORAL_TABLET | Freq: Once | ORAL | 0 refills | Status: AC

## 2024-10-24 ENCOUNTER — Encounter: Payer: Self-pay | Admitting: Certified Nurse Midwife

## 2024-10-24 ENCOUNTER — Other Ambulatory Visit: Payer: Self-pay

## 2024-10-24 MED ORDER — METRONIDAZOLE 500 MG PO TABS: 500.0000 mg | ORAL_TABLET | Freq: Two times a day (BID) | ORAL | 0 refills | Status: DC

## 2024-10-26 ENCOUNTER — Other Ambulatory Visit: Payer: Self-pay

## 2024-10-26 MED ORDER — METRONIDAZOLE 500 MG PO TABS: 500.0000 mg | ORAL_TABLET | Freq: Two times a day (BID) | ORAL | 0 refills | Status: AC
